# Patient Record
Sex: Male | Born: 1980 | Race: White | Hispanic: No | Smoking: Current every day smoker
Health system: Southern US, Community
[De-identification: ages and names within clinical notes are randomized; demographics above are authoritative.]

## PROBLEM LIST (undated history)

## (undated) DIAGNOSIS — F32A Depression, unspecified: Secondary | ICD-10-CM

## (undated) DIAGNOSIS — R519 Headache, unspecified: Secondary | ICD-10-CM

## (undated) HISTORY — PX: BACK SURGERY: SHX140

## (undated) HISTORY — PX: WISDOM TOOTH EXTRACTION: SHX21

---

## 2001-05-20 ENCOUNTER — Encounter: Payer: Self-pay | Admitting: Emergency Medicine

## 2001-05-20 ENCOUNTER — Emergency Department (HOSPITAL_COMMUNITY): Admission: EM | Admit: 2001-05-20 | Discharge: 2001-05-20 | Payer: Self-pay | Admitting: Emergency Medicine

## 2001-05-20 ENCOUNTER — Emergency Department (HOSPITAL_COMMUNITY): Admission: EM | Admit: 2001-05-20 | Discharge: 2001-05-20 | Payer: Self-pay | Admitting: *Deleted

## 2003-08-13 ENCOUNTER — Emergency Department (HOSPITAL_COMMUNITY): Admission: EM | Admit: 2003-08-13 | Discharge: 2003-08-13 | Payer: Self-pay | Admitting: Emergency Medicine

## 2004-06-12 ENCOUNTER — Encounter: Admission: RE | Admit: 2004-06-12 | Discharge: 2004-06-12 | Payer: Self-pay | Admitting: Neurosurgery

## 2004-06-26 ENCOUNTER — Encounter: Admission: RE | Admit: 2004-06-26 | Discharge: 2004-06-26 | Payer: Self-pay | Admitting: Neurosurgery

## 2006-09-16 ENCOUNTER — Encounter: Admission: RE | Admit: 2006-09-16 | Discharge: 2006-09-16 | Payer: Self-pay | Admitting: Family Medicine

## 2006-09-30 ENCOUNTER — Encounter: Admission: RE | Admit: 2006-09-30 | Discharge: 2006-09-30 | Payer: Self-pay | Admitting: Family Medicine

## 2006-10-21 ENCOUNTER — Encounter: Admission: RE | Admit: 2006-10-21 | Discharge: 2006-10-21 | Payer: Self-pay | Admitting: Family Medicine

## 2007-05-04 ENCOUNTER — Ambulatory Visit (HOSPITAL_COMMUNITY)
Admission: RE | Admit: 2007-05-04 | Discharge: 2007-05-04 | Payer: Self-pay | Admitting: Physical Medicine and Rehabilitation

## 2007-07-05 ENCOUNTER — Ambulatory Visit (HOSPITAL_COMMUNITY): Admission: RE | Admit: 2007-07-05 | Discharge: 2007-07-06 | Payer: Self-pay | Admitting: Orthopedic Surgery

## 2007-09-28 ENCOUNTER — Encounter (INDEPENDENT_AMBULATORY_CARE_PROVIDER_SITE_OTHER): Payer: Self-pay | Admitting: Orthopedic Surgery

## 2007-09-28 ENCOUNTER — Ambulatory Visit (HOSPITAL_COMMUNITY): Admission: RE | Admit: 2007-09-28 | Discharge: 2007-09-29 | Payer: Self-pay | Admitting: Orthopedic Surgery

## 2009-04-03 IMAGING — CR DG ORBITS FOR FOREIGN BODY
2 series · 2 of 2 positions shown · non-contrast
Comparison: none

CLINICAL DATA: Pre-MRI.
 ORBITS ? 2 VIEW:

[w waters (1 of 2)]
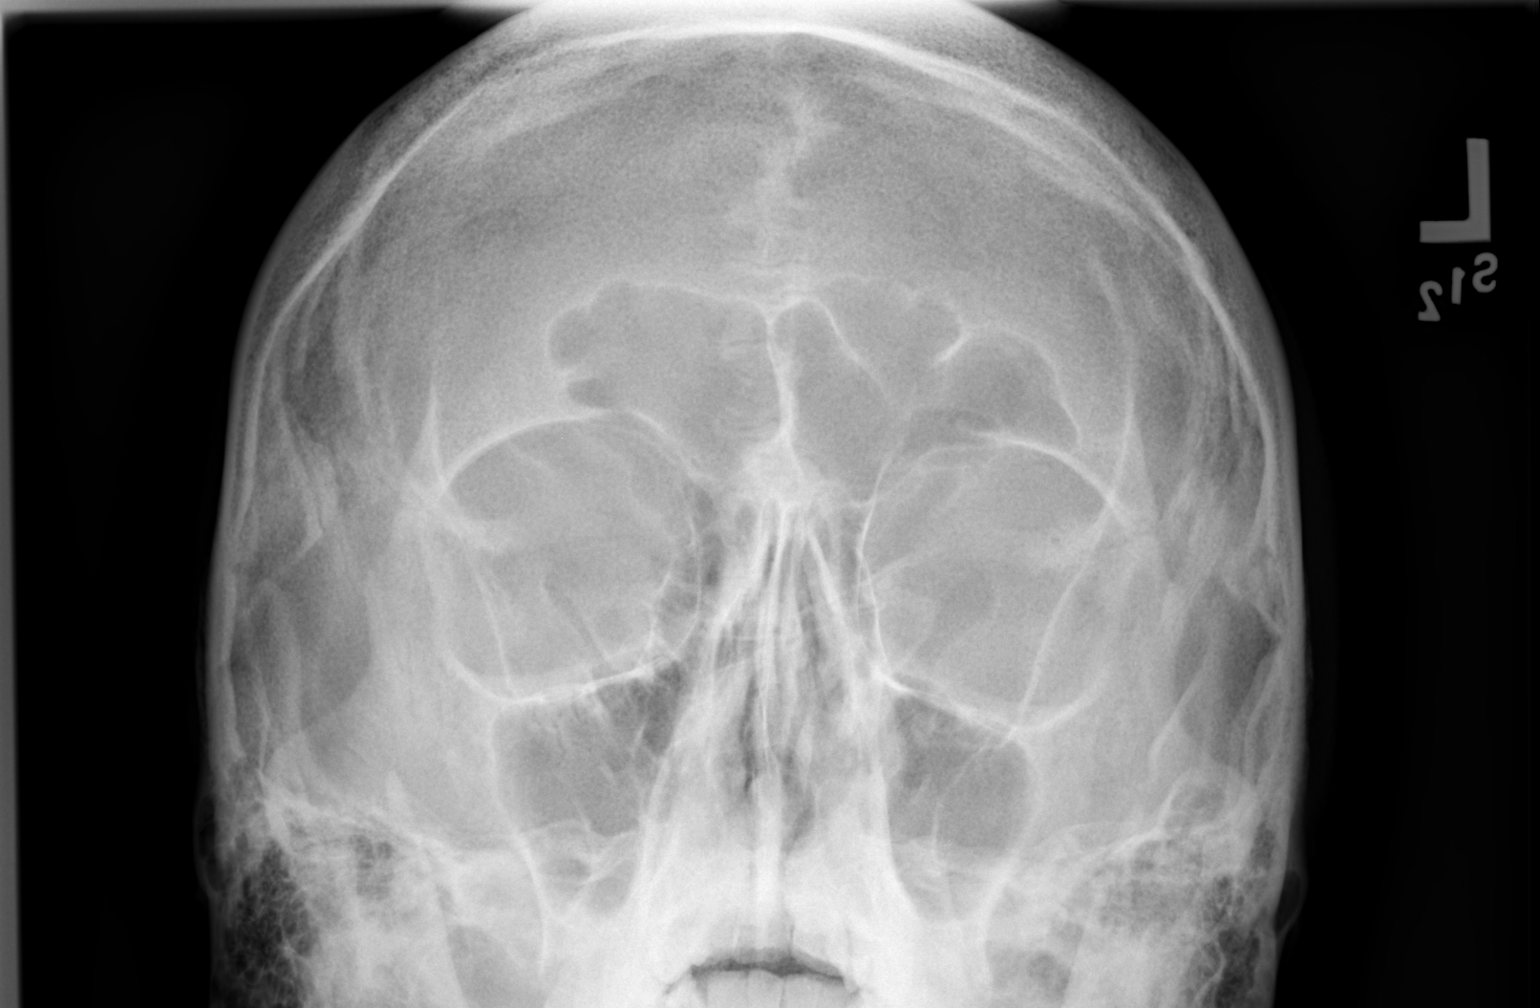

[w waters (2 of 2)]
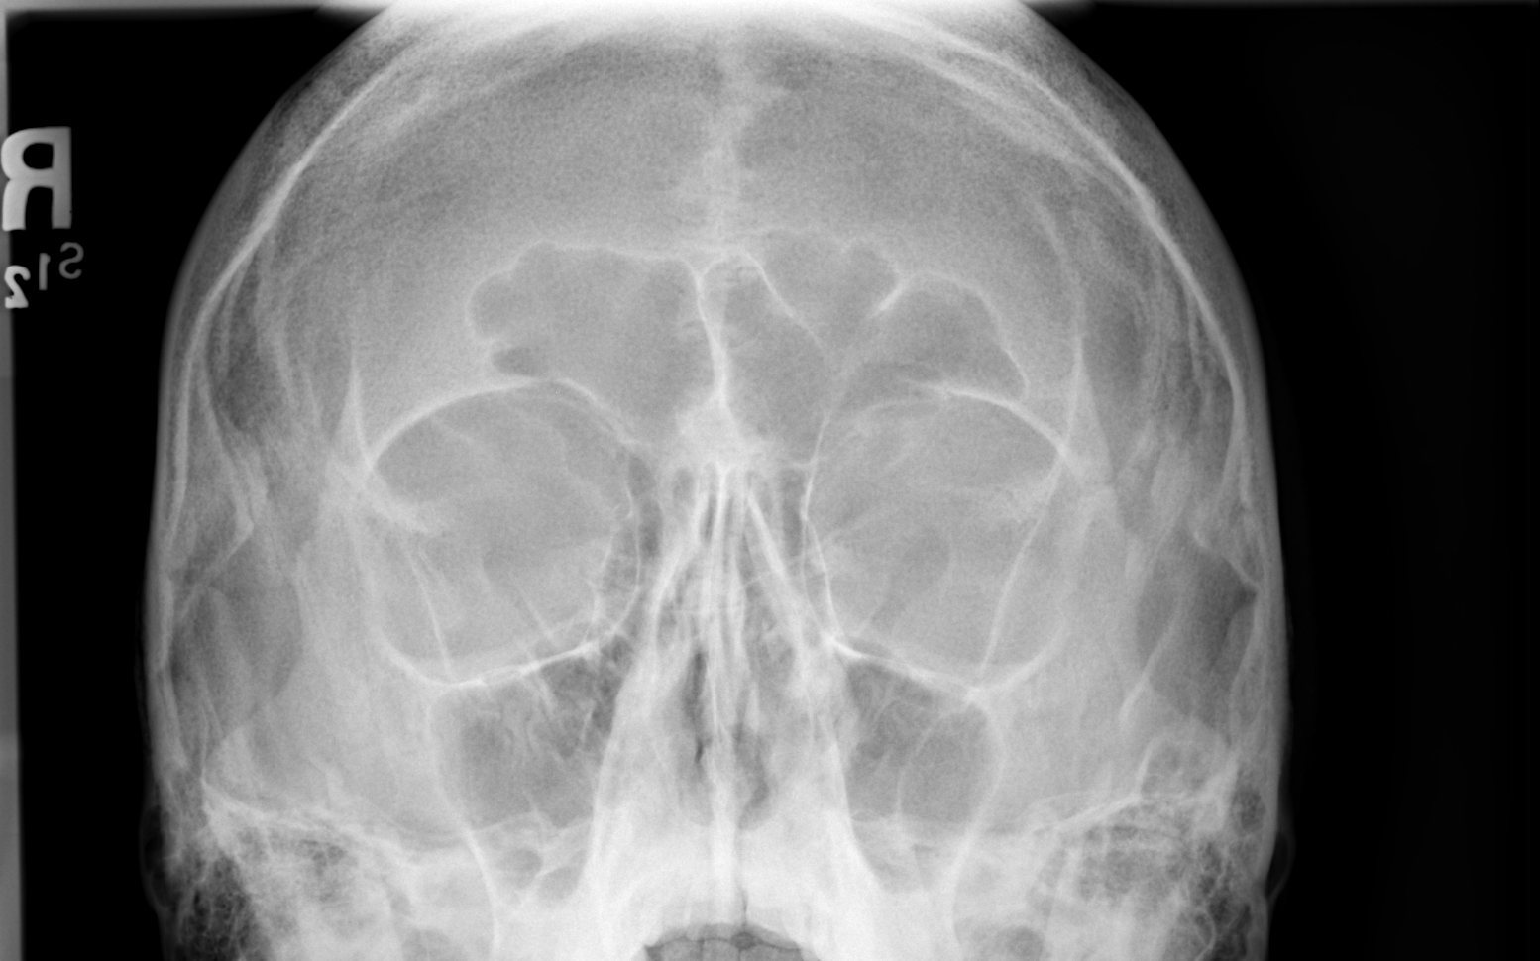

[2 of 2 positions shown; findings below may reference images not displayed]

FINDINGS: Views of the orbits were obtained with the patient looking to the left and looking to the right. No orbital metallic foreign body is seen. No bony abnormality is noted. The sinuses are clear.
IMPRESSION: No orbital metallic foreign body.

## 2009-06-02 IMAGING — CR DG LUMBAR SPINE 2-3V
2 series · 2 of 2 positions shown · non-contrast
Comparison: None.

CLINICAL DATA: 27-year-old male with herniated disc.

LUMBAR SPINE - 2-3 VIEW

[t l-spine a.p.]
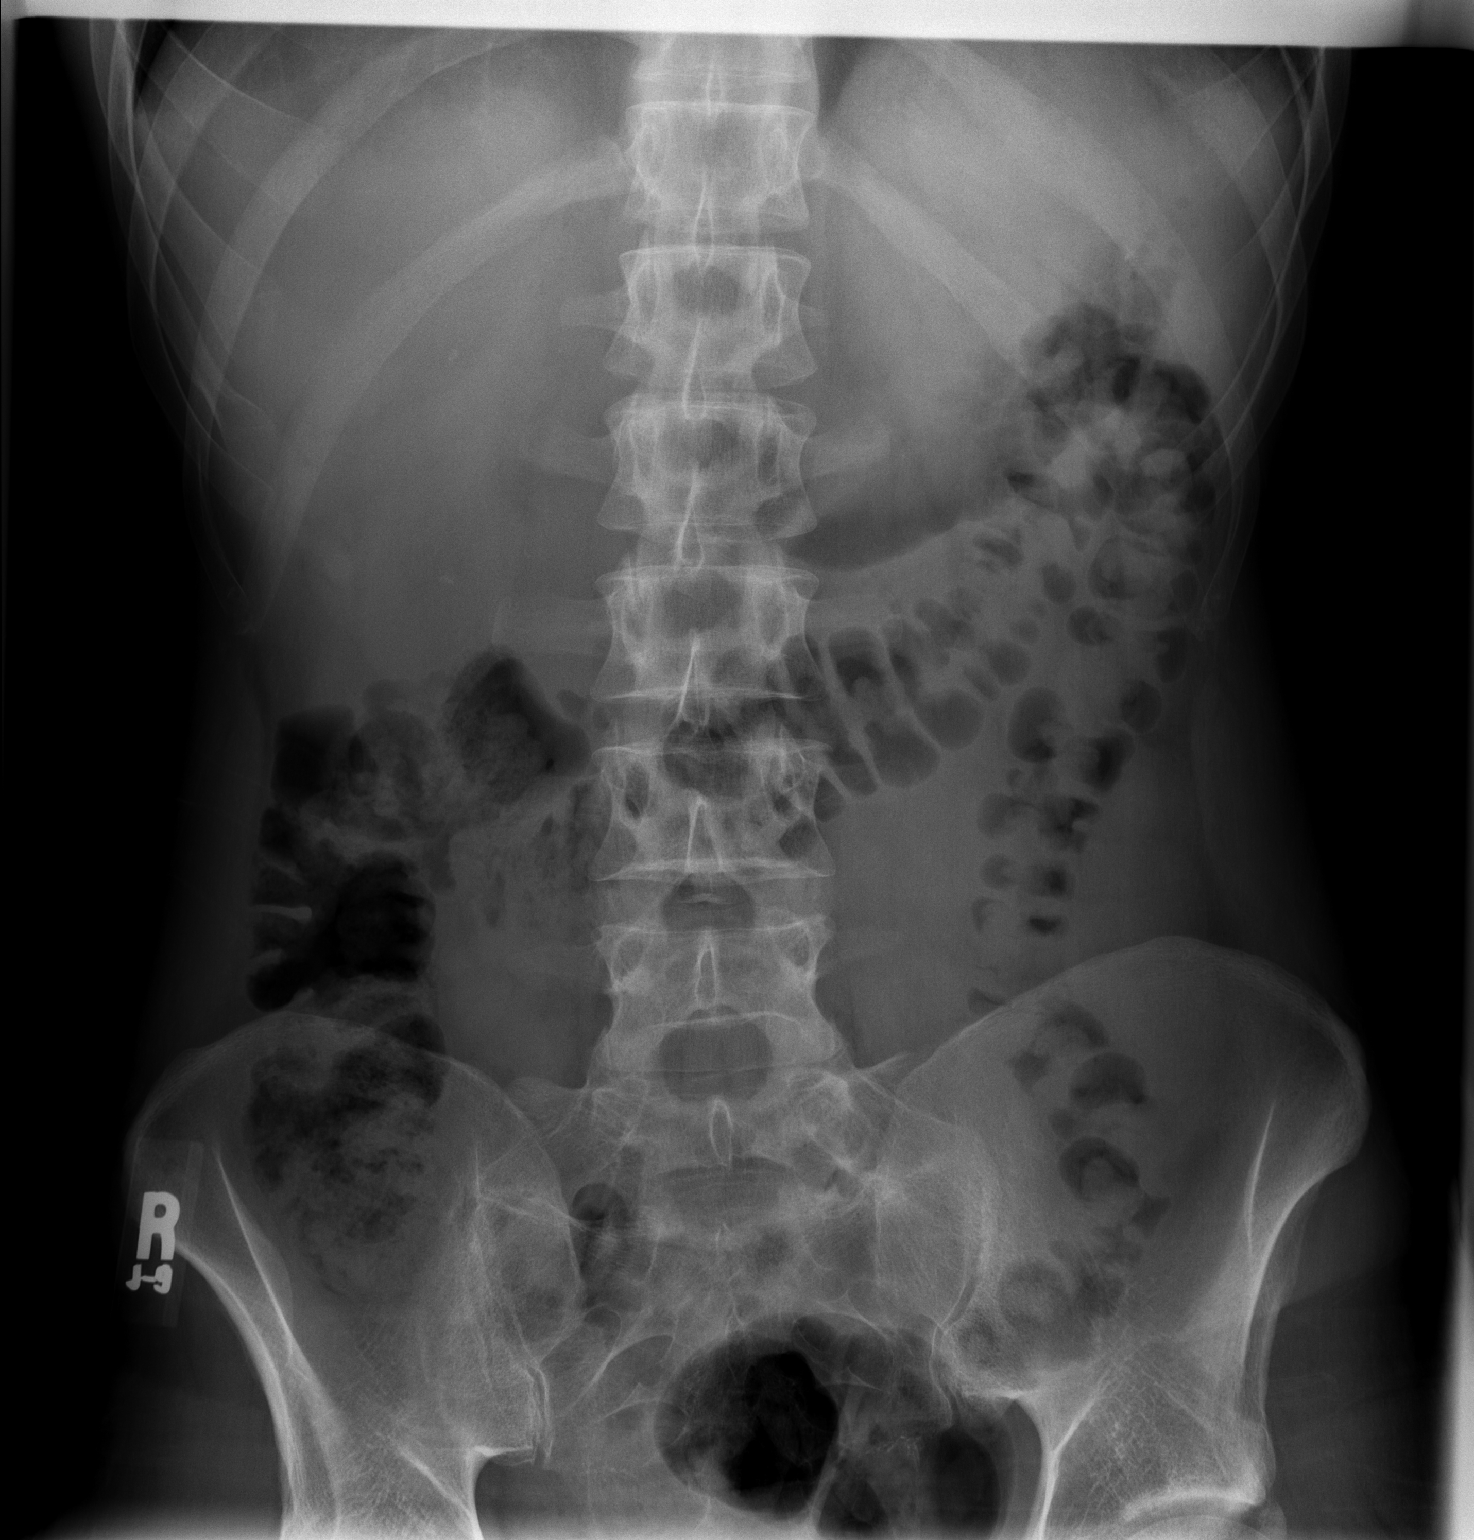

[t l-spine lat]
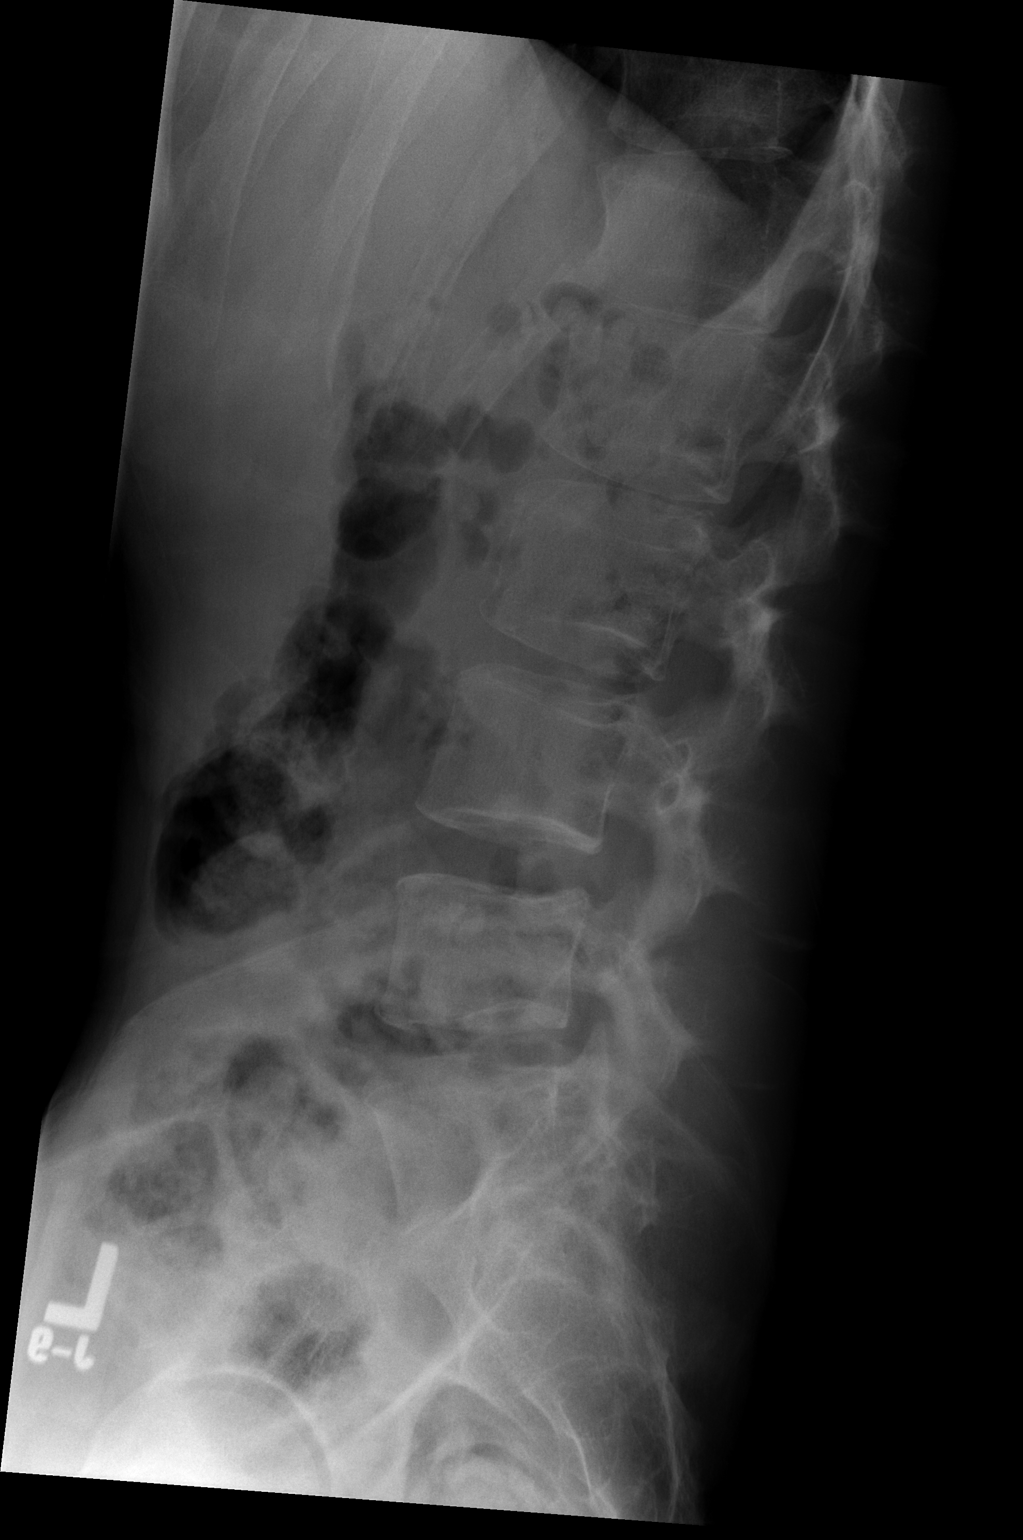

[2 of 2 positions shown; findings below may reference images not displayed]

It is stated that the patient has had an MRI
done at [HOSPITAL].  These images are not currently available.
FINDINGS: Five lumbar-type, nonrib-bearing vertebral bodies are
present.  There is a transitional segment below this level.  The
transitional segment appears to be S1 with the L4-5 disc level at
approximately the level of the iliac crest.  The vertebral body
heights and alignment are maintained.  There is a small disc base
of what is presumed to be S1-2.

Two punctate calcifications are projected over the right renal
shadow.  Nephrolithiasis is considered.
IMPRESSION: 1.  The lumbar spine is labeled with a transitional S1 segment.
2.  Two punctate calcifications projecting over the right renal
shadow may be kidney stones.

## 2009-06-04 IMAGING — CR DG OR PORTABLE SPINE
1 series · 1 of 1 positions shown · non-contrast
Comparison: Lumbar spine x-rays 07/03/2007.  Outside MRI image
viewed.

CLINICAL DATA: L5-S1 disc herniation.

PORTABLE SPINE 1 VIEW 07/05/2007:

[view not recorded]
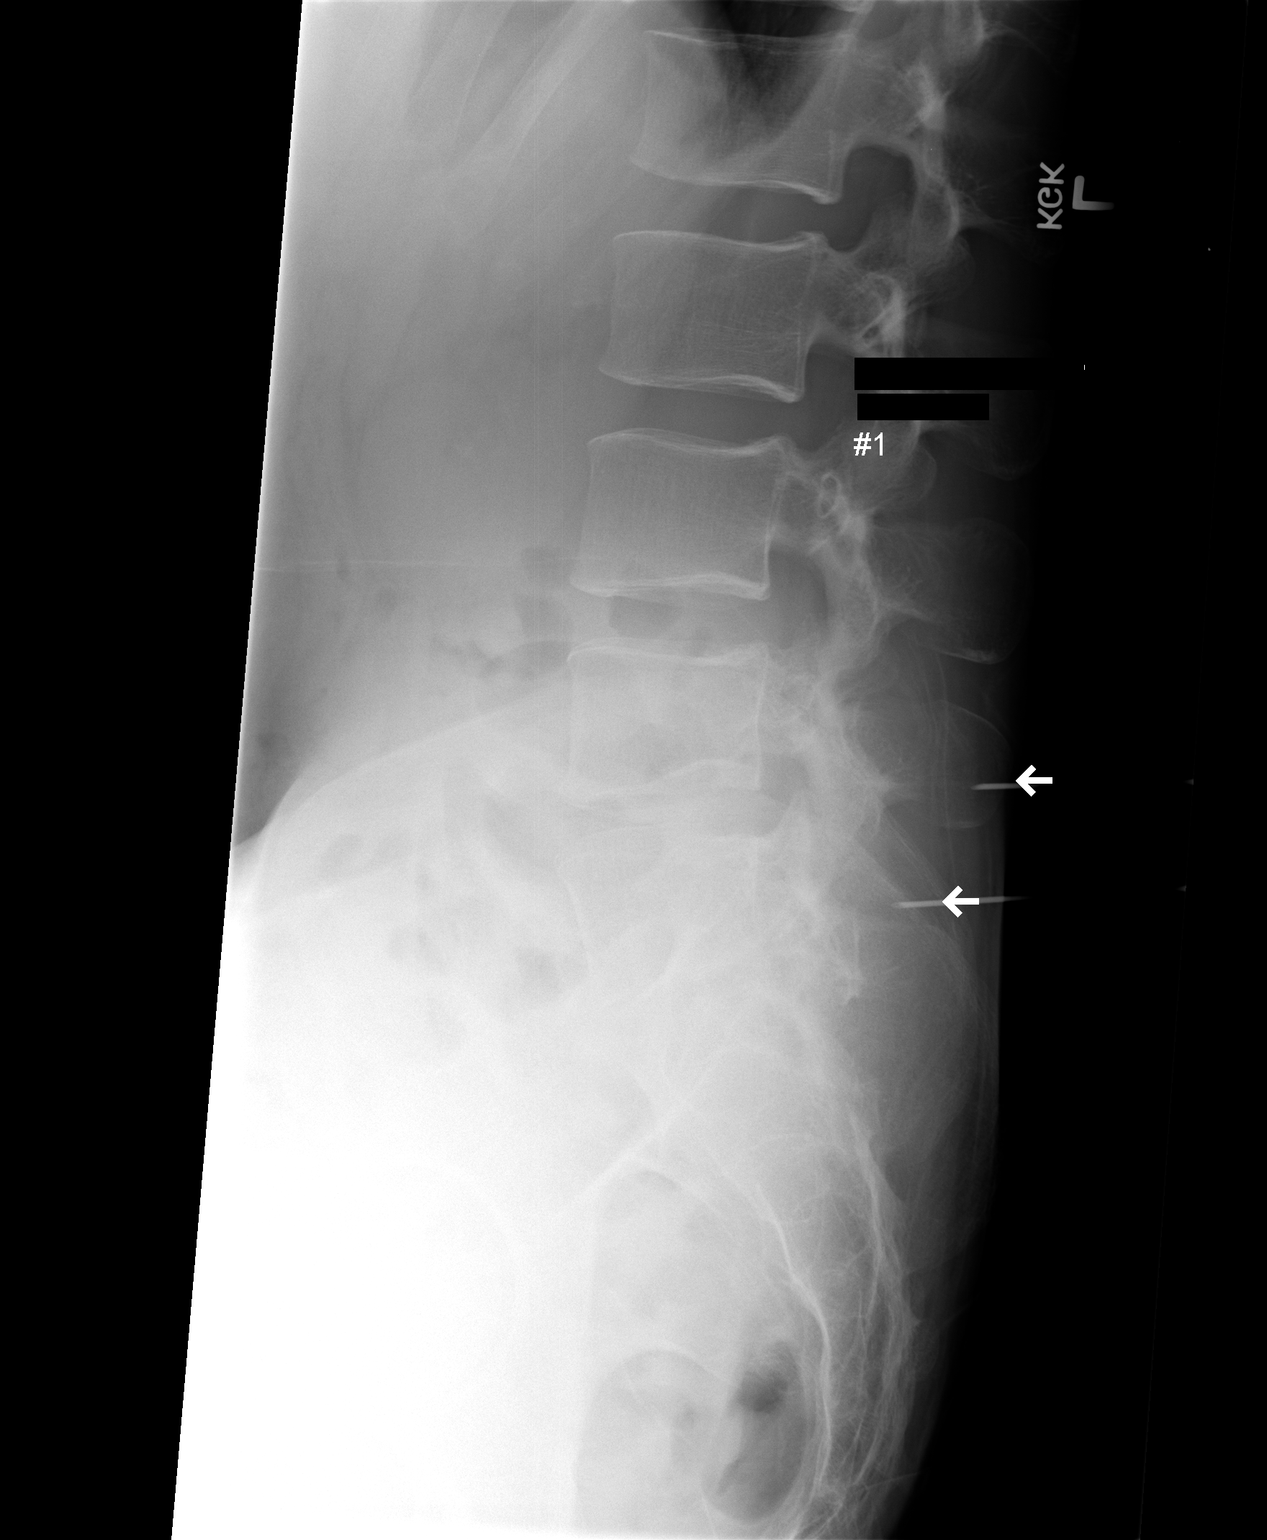

[1 of 1 positions shown; findings below may reference images not displayed]

FINDINGS: Lumbar spine x-rays 07/03/2007 demonstrated five non-rib
bearing lumbar vertebra, with a transitional S1 segment with a well-
defined disc space between it and S2. This initial image obtained
at 6661 hours demonstrates localizing needles adjacent to the
spinous processes of L5 and the transitional S1 segment.
IMPRESSION: L5 and transitional S1 segments localized intraoperatively.

## 2009-06-04 IMAGING — CR DG OR PORTABLE SPINE
1 series · 1 of 1 positions shown · non-contrast
Comparison: Lumbar spine x-rays 07/03/2007.  Outside MRI image
viewed.

CLINICAL DATA: L5-S1 disc herniation.

PORTABLE SPINE 1 VIEW 07/05/2007:

[view not recorded]
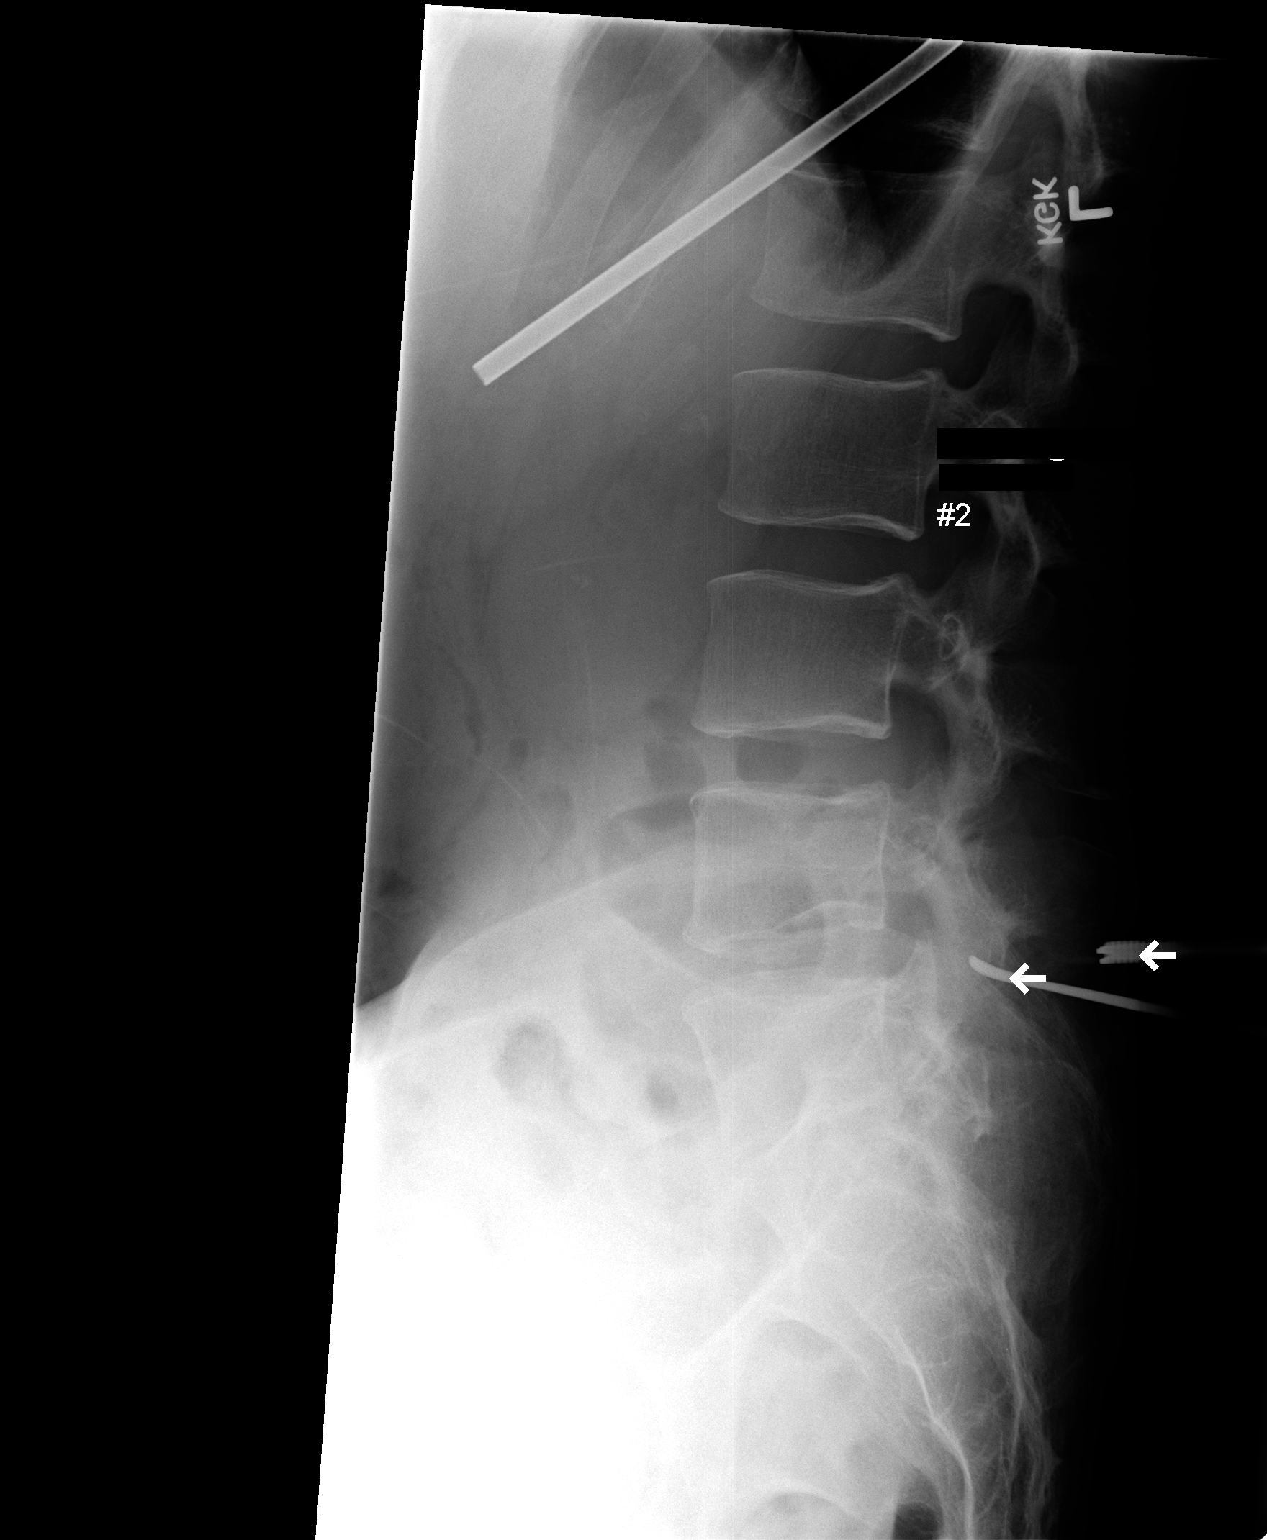

[1 of 1 positions shown; findings below may reference images not displayed]

FINDINGS: Lumbar spine x-rays 07/03/2007 demonstrated five non-rib
bearing lumbar vertebra, with a transitional S1 segment with a well-
defined disc space between it and S2. This second image obtained at
5575 hours demonstrates the localizer device directed toward the L5
- transitional S1 disc space.
IMPRESSION: Intraoperative localization of L5 - transitional S1.

## 2010-07-21 NOTE — Op Note (Signed)
[name], [name]                 ACCOUNT NO.:  [id number]   MEDICAL RECORD NO.:  [id number]          PATIENT TYPE:  OIB   LOCATION:  0098                         FACILITY:  [facility]   PHYSICIAN:  Georges Lynch. Gioffre, M.D.DATE OF BIRTH:  11-08-1980   DATE OF PROCEDURE:  09/28/2007  DATE OF DISCHARGE:                               OPERATIVE REPORT   SURGEON:  Georges Lynch. Darrelyn Hillock, M.D.   ASSISTANT:  Jene Every, M.D.   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  Note:  Prior to doing the  surgery, we initially had an MRI from [facility] that labeled the  space L5-S1 the disk herniation.  We repeated an MRI on him after his  first surgery because of recurrent pain.  The MRI done here showed a  recurrent disk at the same space, only they labeled that here at 4/5 but  there was a difference in calling this same space by a different number.  But anyway, I personally went over that with the radiologist before the  surgery and we both agreed that there was a RECURRENT DISK AT THE  PREVIOUS SURGICAL SITE, WHICH IS L5-S1, so we would get that straight.   OPERATION:  1. Hemilaminectomy at L5-S1 on the right.  2. Microdiskectomy at L5-S1 on the right for recurrent disk.  3. Foraminotomy at L5-S1 on the right.  4. Excision of scar tissues at L5-S1 on the right.   DESCRIPTION OF PROCEDURE:  Under general anesthesia with the patient on  a spinal frame, routine orthopedic prep and draping of the low back was  carried out.  Two needles were placed in the back and an x-ray was  taken.  The x-ray was gone over with the radiologist because of the  bizarre labeling of the vertebrae.  I explained to the radiologist on  the phone during the procedure that the disk was a recurrent disk as  read on the MRI at the same level where he had previous surgeries, so  that was at L5-S1.  So basically an incision was made that went down the  midline.  We separated the muscle bilaterally from the spinous process  in order insert our retractors.  I then went down and identified the  previous surgical site, which we will call L5-S1 on the right.  There  was a marked amount of scar tissue.  I removed the scar tissue from the  lamina at L5-S1 on the right.  We then went down and removed the  excessive scar tissue of the lateral recess.  We curetted out the  lateral recess, decompressed that, and identified the S1 root.  Note:  The root was quite scarred down at this point.  We gently retracted the  dura and noted another large recurrent disk herniation.  We went down  and made a cruciate incision in the posterior longitudinal ligament,  went down and did a diskectomy at L5-S1 on the right.  When we completed  procedure, we also did a foraminotomy for the  S1 root.  The root down,  the dura was free.  The root was quite swollen.  SO THERE WAS NOT A  QUESTION OF WHERE WE WERE; IT IS A MATTER OF HOW YOU WANT TO LABEL THE  VERTEBRAE.  We were at the previous operative site for a recurrent disk  at L4-5 on the right and that is what the MRI said as well.  We  thoroughly irrigated out the area.  No Gelfoam was used.  We then closed  the wound in layers in the usual  fashion.  I left a small deep portion of the wound open for drainage  purposes.  The subcu was closed with 0 Vicryl.  THE SKIN THIS TIME WAS  CLOSED WITH 3-0 NYLON SUTURE BECAUSE HE HAD A LITTLE PROBLEM WITH  STAPLES LAST TIME.  He had 1 gram of IV Ancef preop.           ______________________________  Georges Lynch. Darrelyn Hillock, M.D.     RAG/MEDQ  D:  09/28/2007  T:  09/28/2007  Job:  [id number]

## 2010-07-21 NOTE — Op Note (Signed)
NAMEMAXMILLIAN, [name]                 ACCOUNT NO.:  [id number]   MEDICAL RECORD NO.:  [id number]          PATIENT TYPE:  AMB   LOCATION:  DAY                          FACILITY:  [facility]   PHYSICIAN:  Ronald A. Gioffre, M.D.DATE OF BIRTH:  [birth year]   DATE OF PROCEDURE:  07/05/2007  DATE OF DISCHARGE:                               OPERATIVE REPORT   SURGEON:  Georges Lynch. Darrelyn Hillock, M.D.   ASSISTANT:  Jene Every, M.D.   PREOPERATIVE DIAGNOSIS:  Large herniated lumbar disk, L5-S1 on the  right.  Note he had transitional vertebra at S1-S2 but the L5 vertebra  was somewhat  retrolisthesis in other words, there was retrolisthesis of 5 on 1 which  helped more to identify the exact space and it was the last large open  space.  Note prior to the surgery, great care was taken.  I went to the  radiologist and had him help me label these films. We labeled the plain  films, the MRI, so we made sure that we were all talking about the same  space.  We also went through that at the time of surgery several times.  We marked the space with the back open make to sure we were at the  proper space.   POSTOPERATIVE DIAGNOSIS:  Large herniated lumbar disk, L5-S1 on the  right.   OPERATION:  Hemilaminectomy, microdiskectomy, L5-S1 on the right.   PROCEDURE:  Under general anesthesia, routine orthopedic prep and drape  of the low back carried out.  Two needles were placed in back for  localization purposes.  An x-ray was taken.  I asked the specifically to  read those films to make sure that we were at the proper space and  coordinate that with the MRI and we were.  So we then proceeded with the  incision over the L5-S1 space.  Bleeders identified and cauterized and  cauterized.  Incision was carried down through to the spinous processes.  We separated the muscle from the spinous process and the lamina at L5-  S1.  We identified the sacrum.  Another x-ray was taken to verify the  space and that x-ray was  read as well.  Following that we brought the  microscope in, removed the ligamentum flavum, with great care taken not  to injure the dura.  We completed our hemilaminectomy proximally and  distally, did a nice foraminotomy distally.  We identified a nerve root,  gently retracted it.  That is the S1 root.  Following that, we made a  cruciate incision in the disk into the posterior longitudinal ligament  and disk material extruded out under pressure.  He had a large herniated  disk as was shown on the MRI.  We completed diskectomy by utilizing a  nerve hook point going into the subligamentous space as well as the  Epstein curettes.  We went down into the space several times and across  the midline and removed all the disk material and also went out  laterally.  When this was completed, we thoroughly decompressed the root  and the dura and  were able to easily move the root back and forth  without any resistance and we were able to easily pass a hockey-stick  down the foramen.  We thoroughly irrigated out the area, loosely applied  some thrombin-soaked Gelfoam, closed the wound layers in usual fashion.  I left a small deep distal part of the wound open for drainage purposes.  Subcu was closed with 0 Vicryl.  The skin was closed with metal staples.  Sterile Neosporin dressing was applied.  The patient had 1 gram of IV  Ancef preop.           ______________________________  Georges Lynch. Darrelyn Hillock, M.D.     RAG/MEDQ  D:  07/05/2007  T:  07/05/2007  Job:  [id number]

## 2010-12-01 LAB — DIFFERENTIAL
Basophils Absolute: 0
Basophils Relative: 0
Eosinophils Absolute: 0.2
Eosinophils Relative: 2
Lymphocytes Relative: 36
Lymphs Abs: 3.1
Monocytes Absolute: 0.5
Monocytes Relative: 6
Neutro Abs: 4.9
Neutrophils Relative %: 56

## 2010-12-01 LAB — CBC
HCT: 41.2
Hemoglobin: 14.3
MCHC: 34.6
MCV: 98.2
Platelets: 250
RBC: 4.2 — ABNORMAL LOW
RDW: 12.3
WBC: 8.7

## 2010-12-01 LAB — COMPREHENSIVE METABOLIC PANEL
ALT: 17
AST: 17
Albumin: 4.3
Alkaline Phosphatase: 61
BUN: 10
CO2: 32
Calcium: 9.5
Chloride: 103
Creatinine, Ser: 0.89
GFR calc Af Amer: >60
GFR calc non Af Amer: >60
Glucose, Bld: 102 — ABNORMAL HIGH
Potassium: 4.4
Sodium: 139
Total Bilirubin: 0.7
Total Protein: 6.2

## 2010-12-01 LAB — URINALYSIS, ROUTINE W REFLEX MICROSCOPIC
Bilirubin Urine: NEGATIVE
Glucose, UA: NEGATIVE
Hgb urine dipstick: NEGATIVE
Ketones, ur: NEGATIVE
Nitrite: NEGATIVE
Protein, ur: NEGATIVE
Specific Gravity, Urine: 1.014
Urobilinogen, UA: 0.2
pH: 6

## 2010-12-01 LAB — APTT: aPTT: 32

## 2010-12-01 LAB — PROTIME-INR
INR: 1
Prothrombin Time: 13.6

## 2010-12-04 LAB — DIFFERENTIAL
Basophils Absolute: 0.2 — ABNORMAL HIGH
Basophils Relative: 2 — ABNORMAL HIGH
Eosinophils Absolute: 0.3
Eosinophils Relative: 3
Lymphocytes Relative: 44
Lymphs Abs: 4
Monocytes Absolute: 0.6
Monocytes Relative: 6
Neutro Abs: 4.1
Neutrophils Relative %: 45

## 2010-12-04 LAB — URINALYSIS, ROUTINE W REFLEX MICROSCOPIC
Bilirubin Urine: NEGATIVE
Glucose, UA: NEGATIVE
Hgb urine dipstick: NEGATIVE
Ketones, ur: NEGATIVE
Nitrite: NEGATIVE
Protein, ur: NEGATIVE
Specific Gravity, Urine: 1.02
Urobilinogen, UA: 0.2
pH: 6

## 2010-12-04 LAB — COMPREHENSIVE METABOLIC PANEL
ALT: 23
AST: 23
Albumin: 4.7
Alkaline Phosphatase: 82
BUN: 7
CO2: 30
Calcium: 9.9
Chloride: 104
Creatinine, Ser: 0.91
GFR calc Af Amer: >60
GFR calc non Af Amer: >60
Glucose, Bld: 98
Potassium: 3.4 — ABNORMAL LOW
Sodium: 141
Total Bilirubin: 1
Total Protein: 6.9

## 2010-12-04 LAB — CBC
HCT: 43.6
Hemoglobin: 15.1
MCHC: 34.6
MCV: 98.7
Platelets: 239
RBC: 4.42
RDW: 11.4 — ABNORMAL LOW
WBC: 9.2

## 2010-12-04 LAB — PROTIME-INR
INR: 1
Prothrombin Time: 13.7

## 2010-12-04 LAB — URINE CULTURE
Colony Count: NO GROWTH
Culture: NO GROWTH
Special Requests: NEGATIVE

## 2010-12-04 LAB — APTT: aPTT: 31

## 2011-09-17 ENCOUNTER — Emergency Department (HOSPITAL_COMMUNITY)
Admission: EM | Admit: 2011-09-17 | Discharge: 2011-09-17 | Disposition: A | Discharge status: Home / Self Care | Payer: BC Managed Care – PPO | Attending: Emergency Medicine | Admitting: Emergency Medicine

## 2011-09-17 ENCOUNTER — Encounter (HOSPITAL_COMMUNITY): Payer: Self-pay | Admitting: Emergency Medicine

## 2011-09-17 DIAGNOSIS — M545 Low back pain, unspecified: Secondary | ICD-10-CM | POA: Insufficient documentation

## 2011-09-17 DIAGNOSIS — G8929 Other chronic pain: Secondary | ICD-10-CM

## 2011-09-17 DIAGNOSIS — Z760 Encounter for issue of repeat prescription: Secondary | ICD-10-CM | POA: Insufficient documentation

## 2011-09-17 MED ORDER — HYDROCODONE-ACETAMINOPHEN 5-325 MG PO TABS: 2.0000 | ORAL_TABLET | ORAL | Status: AC | PRN

## 2011-09-17 NOTE — ED Provider Notes (Signed)
History     CSN: [id number]  Arrival date & time 09/17/11  1659   First MD Initiated Contact with Patient 09/17/11 1724      Chief Complaint  Patient presents with  . Medication Refill    (Consider location/radiation/quality/duration/timing/severity/associated sxs/prior treatment) HPI  31 year old male with history of back surgery in 09 presents requesting for pain medication. Patient states he has had chronic pain to his low back. He is in between doctor and has ran out of his pain medication.  Requesting for pain meds as he is afraid of going through withdrawal. His last pain medication was 2 days ago. Patient states he has been schedule to have further management through [facility].  Pt is currently awaiting toxicology test to be cleared before further management.  However, his medication is out and his PCP refuses to refill his meds.  Pt sts pain to low back and R leg has been the same.  Denies fever, rash, urinary/bowel incontinence or caudal equina sxs.  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      No past medical history on file.  Past Surgical History  Procedure Date  . Back surgery     No family history on file.  History  Substance Use Topics  . Smoking status: Not on file  . Smokeless tobacco: Not on file  . Alcohol Use:       Review of Systems  Constitutional: Negative for fever.  Cardiovascular: Negative for leg swelling.  Genitourinary: Negative for testicular pain.  Skin: Negative for rash and wound.  Neurological: Negative for weakness and numbness.  All other systems reviewed and are negative.    Allergies  Review of patient's allergies indicates no known allergies.  Home Medications   Current Outpatient Rx  Name Route Sig Dispense Refill  . HYDROCODONE-ACETAMINOPHEN 10-325 MG PO TABS Oral Take 1 tablet by mouth every 6 (six) hours as needed. Pain      BP 139/52   Pulse 88  Temp 98.9 F (37.2 C) (Oral)  Resp 20  Wt 187 lb (84.823 kg)  SpO2 100%  Physical Exam  Nursing note and vitals reviewed. Constitutional: He appears well-developed and well-nourished. No distress.  HENT:  Head: Atraumatic.  Eyes: Conjunctivae are normal.  Neck: Neck supple.  Cardiovascular: Normal rate and regular rhythm.   Pulmonary/Chest: Effort normal. No respiratory distress.  Abdominal: Soft. There is no tenderness.  Musculoskeletal: Normal range of motion. He exhibits no edema and no tenderness.       Cervical back: Normal.       Thoracic back: Normal.       Lumbar back: Normal.  Neurological: He is alert.  Skin: Skin is warm. No rash noted.    ED Course  Procedures (including critical care time)  Labs Reviewed - No data to display No results found.   No diagnosis found.  1. Chronic back pain.  MDM  Pt with chronic back pain. No red flags, no new pain.  I have checked pt using [place] drug database, and pt appears to be compliant with his pain medication.  His last prescription is 6/11 for 25 days supply of percocet.   Plan to provide a short course of pain medication.  Strong recommendation to continue care through pain management.  Will also give resources.        Fayrene Helper, PA-C 09/17/11 1751

## 2011-09-17 NOTE — ED Notes (Signed)
Pt. Reports he is in between doctors and his pain medication ran out.  His new doctor cannot give any pain medication until toxicology test done.  Pt. Reports back pain is a 5 right now.  Pt. Has had surgeries for ruptured discs.

## 2011-09-18 NOTE — ED Provider Notes (Signed)
Medical screening examination/treatment/procedure(s) were performed by non-physician practitioner and as supervising physician I was immediately available for consultation/collaboration.  Gerhard Munch, MD 09/18/11 2138

## 2015-03-10 ENCOUNTER — Emergency Department (HOSPITAL_BASED_OUTPATIENT_CLINIC_OR_DEPARTMENT_OTHER)
Admission: EM | Admit: 2015-03-10 | Discharge: 2015-03-10 | Disposition: A | Discharge status: Home / Self Care | Payer: BLUE CROSS/BLUE SHIELD | Attending: Emergency Medicine | Admitting: Emergency Medicine

## 2015-03-10 ENCOUNTER — Encounter (HOSPITAL_BASED_OUTPATIENT_CLINIC_OR_DEPARTMENT_OTHER): Payer: Self-pay | Admitting: *Deleted

## 2015-03-10 DIAGNOSIS — F1210 Cannabis abuse, uncomplicated: Secondary | ICD-10-CM | POA: Diagnosis not present

## 2015-03-10 DIAGNOSIS — F419 Anxiety disorder, unspecified: Secondary | ICD-10-CM | POA: Diagnosis not present

## 2015-03-10 DIAGNOSIS — R110 Nausea: Secondary | ICD-10-CM | POA: Diagnosis not present

## 2015-03-10 DIAGNOSIS — F32A Depression, unspecified: Secondary | ICD-10-CM

## 2015-03-10 DIAGNOSIS — R630 Anorexia: Secondary | ICD-10-CM | POA: Insufficient documentation

## 2015-03-10 DIAGNOSIS — F17210 Nicotine dependence, cigarettes, uncomplicated: Secondary | ICD-10-CM | POA: Insufficient documentation

## 2015-03-10 DIAGNOSIS — R251 Tremor, unspecified: Secondary | ICD-10-CM | POA: Diagnosis not present

## 2015-03-10 DIAGNOSIS — Z008 Encounter for other general examination: Secondary | ICD-10-CM | POA: Diagnosis present

## 2015-03-10 DIAGNOSIS — F329 Major depressive disorder, single episode, unspecified: Secondary | ICD-10-CM | POA: Diagnosis not present

## 2015-03-10 LAB — CBC WITH DIFFERENTIAL/PLATELET
Basophils Absolute: 0 10*3/uL (ref 0.0–0.1)
Basophils Relative: 0 %
Eosinophils Absolute: 0.1 10*3/uL (ref 0.0–0.7)
Eosinophils Relative: 1 %
HCT: 44.2 % (ref 39.0–52.0)
Hemoglobin: 15.3 g/dL (ref 13.0–17.0)
Lymphocytes Relative: 23 %
Lymphs Abs: 2.4 10*3/uL (ref 0.7–4.0)
MCH: 33.2 pg (ref 26.0–34.0)
MCHC: 34.6 g/dL (ref 30.0–36.0)
MCV: 95.9 fL (ref 78.0–100.0)
Monocytes Absolute: 0.9 10*3/uL (ref 0.1–1.0)
Monocytes Relative: 8 %
Neutro Abs: 7.2 10*3/uL (ref 1.7–7.7)
Neutrophils Relative %: 68 %
Platelets: 311 10*3/uL (ref 150–400)
RBC: 4.61 MIL/uL (ref 4.22–5.81)
RDW: 11.7 % (ref 11.5–15.5)
WBC: 10.5 10*3/uL (ref 4.0–10.5)

## 2015-03-10 LAB — RAPID URINE DRUG SCREEN, HOSP PERFORMED
Amphetamines: NOT DETECTED
Barbiturates: NOT DETECTED
Benzodiazepines: NOT DETECTED
Cocaine: NOT DETECTED
Opiates: NOT DETECTED
Tetrahydrocannabinol: POSITIVE — AB

## 2015-03-10 LAB — COMPREHENSIVE METABOLIC PANEL
ALT: 14 U/L — ABNORMAL LOW (ref 17–63)
AST: 17 U/L (ref 15–41)
Albumin: 4.8 g/dL (ref 3.5–5.0)
Alkaline Phosphatase: 91 U/L (ref 38–126)
Anion gap: 8 (ref 5–15)
BUN: 9 mg/dL (ref 6–20)
CO2: 24 mmol/L (ref 22–32)
Calcium: 9.4 mg/dL (ref 8.9–10.3)
Chloride: 107 mmol/L (ref 101–111)
Creatinine, Ser: 0.83 mg/dL (ref 0.61–1.24)
GFR calc Af Amer: >60 mL/min (ref >60)
GFR calc non Af Amer: >60 mL/min (ref >60)
Glucose, Bld: 96 mg/dL (ref 65–99)
Potassium: 3.3 mmol/L — ABNORMAL LOW (ref 3.5–5.1)
Sodium: 139 mmol/L (ref 135–145)
Total Bilirubin: 1 mg/dL (ref 0.3–1.2)
Total Protein: 7.4 g/dL (ref 6.5–8.1)

## 2015-03-10 LAB — ETHANOL: Alcohol, Ethyl (B): <5 mg/dL (ref <5)

## 2015-03-10 MED ORDER — HYDROXYZINE HCL 25 MG PO TABS: ORAL_TABLET | ORAL | Status: DC

## 2015-03-10 MED ORDER — LORAZEPAM 1 MG PO TABS
1.0000 mg | ORAL_TABLET | Freq: Once | ORAL | Status: AC
  Administered 2015-03-10: 1 mg via ORAL
  Filled 2015-03-10: qty 1

## 2015-03-10 MED ORDER — TRAZODONE HCL 50 MG PO TABS: 50.0000 mg | ORAL_TABLET | Freq: Every day | ORAL | Status: DC

## 2015-03-10 NOTE — ED Provider Notes (Addendum)
CSN: [id number]     Arrival date & time 03/10/15  1525 History  By signing my name below, I, Budd Palmer, attest that this documentation has been prepared under the direction and in the presence of Margarita Grizzle, MD. Electronically Signed: Budd Palmer, ED Scribe. 03/10/2015. 4:54 PM.    Chief Complaint  Patient presents with  . Medical Clearance   The history is provided by the patient and a parent. No language interpreter was used.   HPI Comments: [name] is a 35 y.o. male smoker at 1 ppd who presents to the Emergency Department for medical clearance. Pt states he has come in for depression. He notes that he has had a tough past year and feels as though nothing he does makes a difference to change this. He notes that his girlfriend recently cheated on him. He reports associated loss of appetite, inability to sleep, nausea, palpitations, and tremors earlier today. He notes he felt as though he was having a panic attack earlier today. He reports a PSHx of back surgery and states he takes three 10 mg percocet and three 25 mg oxycodone per day. He notes that sometimes he takes more than these dosages, depending on his pain and runs out a few days early every once in a while. He states his PCP is aware of this and has prescribed him some medications to treat the withdrawal symptoms. He notes he has also been drinking alcohol as well (3-4 shots of vodka every night). He denies a PMHx of alcohol issues.  He notes he is still working. He notes he smokes marijuana occasionally, but has not done so for the past week. Pt denies SI and MI.   Per mom, pt came to see her last night and talked with her. She denies pt having SI or threatening to harm himself.   History reviewed. No pertinent past medical history. Past Surgical History  Procedure Laterality Date  . Back surgery     No family history on file. Social History  Substance Use Topics  . Smoking status: Current Every Day Smoker -- 1.00  packs/day    Types: Cigarettes  . Smokeless tobacco: None  . Alcohol Use: Yes     Comment: daily    Review of Systems  Constitutional: Positive for activity change and appetite change.  Cardiovascular: Positive for palpitations.  Gastrointestinal: Positive for nausea.  Neurological: Positive for tremors.  Psychiatric/Behavioral: Positive for dysphoric mood. Negative for suicidal ideas and self-injury. The patient is nervous/anxious.   All other systems reviewed and are negative.   Allergies  Review of patient's allergies indicates no known allergies.  Home Medications   Prior to Admission medications   Medication Sig Start Date End Date Taking? Authorizing Provider  Oxycodone-Acetaminophen (PERCOCET PO) Take by mouth.   Yes Historical Provider, MD   BP 151/104 mmHg  Pulse 108  Temp(Src) 98.2 F (36.8 C) (Oral)  Resp 16  Ht 6\' 6"  (1.981 m)  Wt 185 lb (83.915 kg)  BMI 21.38 kg/m2  SpO2 100% Physical Exam  Constitutional: He is oriented to person, place, and time. He appears well-developed and well-nourished.  HENT:  Head: Normocephalic and atraumatic.  Right Ear: External ear normal.  Left Ear: External ear normal.  Nose: Nose normal.  Mouth/Throat: Oropharynx is clear and moist.  Eyes: Conjunctivae and EOM are normal. Pupils are equal, round, and reactive to light.  Neck: Normal range of motion. Neck supple.  Cardiovascular: Normal rate, regular rhythm, normal heart sounds  and intact distal pulses.   Pulmonary/Chest: Effort normal and breath sounds normal. No respiratory distress. He has no wheezes. He exhibits no tenderness.  Abdominal: Soft. Bowel sounds are normal. He exhibits no distension and no mass. There is no tenderness. There is no guarding.  Musculoskeletal: Normal range of motion.  Neurological: He is alert and oriented to person, place, and time. He has normal reflexes. He exhibits normal muscle tone. Coordination normal.  Skin: Skin is warm and dry.   Psychiatric: His behavior is normal. Judgment and thought content normal. He exhibits a depressed mood.  Nursing note and vitals reviewed.   ED Course  Procedures  DIAGNOSTIC STUDIES: Oxygen Saturation is 100% on RA, normal by my interpretation.    COORDINATION OF CARE: 4:07 PM - Discussed plans to wait on diagnostic studies and to order a mental health consult. Pt advised of plan for treatment and pt agrees.  Labs Review Labs Reviewed  COMPREHENSIVE METABOLIC PANEL - Abnormal; Notable for the following:    Potassium 3.3 (*)    ALT 14 (*)    All other components within normal limits  URINE RAPID DRUG SCREEN, HOSP PERFORMED - Abnormal; Notable for the following:    Tetrahydrocannabinol POSITIVE (*)    All other components within normal limits  ETHANOL  CBC WITH DIFFERENTIAL/PLATELET    Imaging Review No results found. I have personally reviewed and evaluated these images and lab results as part of my medical decision-making.   EKG Interpretation None     Discussed with patient, family and behavioral health assessment team.  Patient and family understand plan for follow up and treatment and are in agreement.  MDM   Final diagnoses:  Anxiety  Depression    I personally performed the services described in this documentation, which was scribed in my presence. The recorded information has been reviewed and considered.   Margarita Grizzleanielle Lasaundra Riche, MD 03/10/15 [id number]  Margarita Grizzleanielle Nurah Petrides, MD 03/25/15 [phone]

## 2015-03-10 NOTE — ED Notes (Signed)
Camera and monitor setup at door when TTS calls.

## 2015-03-10 NOTE — ED Notes (Signed)
States he is under a lot of stress. Feels like he is going to have a panic attack. His mother states he told her he was having bad thoughts last night.

## 2015-03-10 NOTE — ED Notes (Signed)
Tele-a-scope machine was sat up in room 12 and progress will begun in 15 minutes.

## 2015-03-10 NOTE — Discharge Instructions (Signed)
Please call [facility] tomorrow at [phone] for appointment.  They should be aware that you need appointment and may call you.  Return if any thoughts of harming yourself or anyone else.

## 2015-03-10 NOTE — BH Assessment (Addendum)
Tele Assessment Note   [name] is an 35 y.o. male presenting this date having symptoms associated with on going depression. He notes that he has had a series of issues over the last few months associated with relationships and loss that have caused him to be depressed reporting that he "can't stop thinking about my deceased wife." Patient reports he was married and during that marriage his wife passed away and he states he has ongoing feelings of guilt associated with not being able to "let go of her." The relationship is complicated by another current failed relationship where patient reports he has been seeing his current partner for two years and just received information that she was cheating on him. He reports loss of appetite, inability to sleep, nausea, and racing thoughts. Patient denies any SI or thoughts of self harm but does admit to ongoing depression which he rates at a 7 at the time of this assessment. Patient denies any SA use besides alcohol with last reported use on 03/08/15 where he reports he had "a couple shots." Patient is prescribed10 mg percocet x 3 daily for back issues but denies any abuse currently stating he is out of that medication. Patient denies any history of MH or any past use of MH medications but is open to medication interventions to assist with sleep and depression. Collateral information obtained from parents who were present, stated patient has been depressed but they feel due to his strong faith would never harm himself. Patient denies any thoughts of self harm and was offered an [facility] but declined stating he was going to stay with his parents the next few days. Case was staffed with Earlene Plateravis NP who agreed patient did not meet inpatient criteria and suggested patient could be prescribed Trazodone 50 at night followed with Vistaril 25 every six hours as needed upon discharge. This Clinical research associatewriter contacted Ray M.D. At [facility]  and informed of Davis's disposition. Patient was also given follow up information to [facility] for follow up appointment for medication management and therapy. Ava Andria MeuseStevens also [facility] Baruch MerlEric Foushee to have an outpatient coordinator contact patient for follow up care.      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 No pertinent past medical history.  Past Surgical History  Procedure Laterality Date  . Back surgery      Family History: No family history on file.  Social History:  reports that he has been smoking Cigarettes.  He has been smoking about 1.00 pack per day. He does not have any smokeless tobacco history on file. He reports that he drinks alcohol. He reports that he uses illicit drugs (Marijuana).  Additional Social History:  Alcohol / Drug Use Pain Medications: See MAR Prescriptions: See MAR Over the Counter: See MAR History of alcohol / drug use?: Yes Longest period of sobriety (when/how long): three months 2015 Substance #1 Name of Substance 1: Alcohol  1 - Age of First Use: 18 1 - Amount (size/oz): 2 oz. of liquor 1 - Frequency: two to three times a week 1 - Duration: last two years 1 - Last Use / Amount: 03/08/15 patient admitts to using over 4 oz. of alcohol  CIWA: CIWA-Ar BP: 128/84 mmHg Pulse Rate: 90 COWS:    PATIENT STRENGTHS: (choose at least two) Ability for insight Average or above average intelligence General fund of knowledge Motivation for treatment/growth Supportive family/friends  Allergies: No Known Allergies  Home Medications:  (Not in a hospital admission)  OB/GYN Status:  No LMP for male patient.  General Assessment Data Location of Assessment:  (Med Center) TTS Assessment: In system Is this a Tele or Face-to-Face  Assessment?: Tele Assessment Is this an Initial Assessment or a Re-assessment for this encounter?: Initial Assessment Marital status: Single Maiden name: na Is patient pregnant?: No Pregnancy Status: No Living Arrangements: Alone Can pt return to current living arrangement?: Yes Admission Status: Voluntary Is patient capable of signing voluntary admission?: Yes Referral Source: Self/Family/Friend Insurance type: Hotel manager Exam [facility] Walk-in ONLY) Medical Exam completed: Yes  Crisis Care Plan Living Arrangements: Alone Legal Guardian: Other: (none) Name of Psychiatrist: None Name of Therapist: None  Education Status Is patient currently in school?: No Current Grade: na Highest grade of school patient has completed: 12 Name of school: [employer] Contact person: na  Risk to self with the past 6 months Suicidal Ideation: No Has patient been a risk to self within the past 6 months prior to admission? : No Suicidal Intent: No Has patient had any suicidal intent within the past 6 months prior to admission? : No Is patient at risk for suicide?: No Suicidal Plan?: No Has patient had any suicidal plan within the past 6 months prior to admission? : No Access to Means: No What has been your use of drugs/alcohol within the last 12 months?: Last use 03/08/15 Previous Attempts/Gestures: No How many times?: 0 Other Self Harm Risks: none Triggers for Past Attempts: Other (Comment) (denies) Intentional Self Injurious Behavior: None Family Suicide History: No Recent stressful life event(s): Loss (Comment) (relationship issues) Persecutory voices/beliefs?: No Depression: Yes Depression Symptoms: Despondent, Insomnia Substance abuse history and/or treatment for substance abuse?: No Suicide prevention information given to non-admitted patients: Not applicable  Risk to Others within the past 6 months Homicidal Ideation: No Does patient have any lifetime risk of violence  toward others beyond the six months prior to admission? : No Thoughts of Harm to Others: No Current Homicidal Intent: No Current Homicidal Plan: No Access to Homicidal Means: No Identified Victim: na History of harm to others?: No Assessment of Violence: None Noted Violent Behavior Description: na Does patient have access to weapons?: No Criminal Charges Pending?: No Does patient have a court date: No Is patient on probation?: No  Psychosis Hallucinations: None noted Delusions: None noted  Mental Status Report Appearance/Hygiene: Unremarkable Eye Contact: Good Motor Activity: Unremarkable Speech: Unremarkable Level of Consciousness: Alert Mood: Depressed Affect: Depressed Anxiety Level: Minimal Thought Processes: Coherent, Relevant Judgement: Unimpaired Orientation: Person, Place, Time Obsessive Compulsive Thoughts/Behaviors: None  Cognitive Functioning Concentration: Normal Memory: Recent Intact, Remote Intact IQ: Average Insight: Good Impulse Control: Good Appetite: Fair Weight Loss: 0 Weight Gain: 0 Sleep: No Change Total Hours of Sleep: 4 Vegetative Symptoms: None  ADLScreening [facility] Assessment Services) Patient's cognitive ability adequate to safely complete daily activities?: Yes Patient able to express need for assistance with ADLs?: Yes Independently performs ADLs?: Yes (  appropriate for developmental age)  Prior Inpatient Therapy Prior Inpatient Therapy: No Prior Therapy Dates: na Prior Therapy Facilty/Provider(s): none Reason for Treatment: na  Prior Outpatient Therapy Prior Outpatient Therapy: No Prior Therapy Dates: na Prior Therapy Facilty/Provider(s): none Reason for Treatment: na Does patient have an ACCT team?: No Does patient have Intensive In-House Services?  : No Does patient have [facility] services? : No Does patient have P4CC services?: No  ADL Screening (condition at time of admission) Patient's cognitive ability adequate to safely  complete daily activities?: Yes Is the patient deaf or have difficulty hearing?: No Does the patient have difficulty seeing, even when wearing glasses/contacts?: No Does the patient have difficulty concentrating, remembering, or making decisions?: No Patient able to express need for assistance with ADLs?: Yes Does the patient have difficulty dressing or bathing?: No Independently performs ADLs?: Yes (appropriate for developmental age) Does the patient have difficulty walking or climbing stairs?: No Weakness of Legs: None Weakness of Arms/Hands: None  Home Assistive Devices/Equipment Home Assistive Devices/Equipment: None  Therapy Consults (therapy consults require a physician order) PT Evaluation Needed: No OT Evalulation Needed: No SLP Evaluation Needed: No Abuse/Neglect Assessment (Assessment to be complete while patient is alone) Physical Abuse: Denies Verbal Abuse: Denies Sexual Abuse: Denies Exploitation of patient/patient's resources: Denies Self-Neglect: Denies Values / Beliefs Cultural Requests During Hospitalization: None Spiritual Requests During Hospitalization: None Consults Spiritual Care Consult Needed: No Social Work Consult Needed: No Merchant navy officer (For Healthcare) Does patient have an advance directive?: No    Additional Information 1:1 In Past 12 Months?: No CIRT Risk: No Elopement Risk: No Does patient have medical clearance?: Yes     Disposition: Case was staffed with Earlene Plater NP who agreed patient did not meet inpatient criteria and suggested patient could be prescribed Trazodone 50 at night followed with Vistaril 25 every six hours as needed upon discharge. This Clinical research associate contacted Ray M.D. At [facility] and informed of Davis's disposition. Patient was also given follow up information to contact [facility] outpatient for follow up appointment for medication management and therapy. Ava Andria Meuse also contacted [facility] outpatient Baruch Merl to have an  outpatient coordinator contact patient for follow up care.      Disposition Initial Assessment Completed for this Encounter: Yes Disposition of Patient: Referred to [facility]) Patient referred to: Other (Comment) [facility])  Alfredia Ferguson 03/10/2015 5:34 PM

## 2015-03-10 NOTE — ED Notes (Signed)
Patient given back all personal items in the 3 bags.  Bags opened by patient in room

## 2015-03-10 NOTE — ED Notes (Signed)
Taking urine sample to lab for holding.

## 2015-03-10 NOTE — ED Notes (Signed)
Patient stable and ambulatory.  Patient verbalizes understanding of discharge medications, instructions and follow-up. 

## 2015-03-10 NOTE — ED Notes (Signed)
Psychiatric on TTS.  He is requesting parents to be present at this time.  Parents escorted to room with patient.

## 2015-10-16 DIAGNOSIS — R112 Nausea with vomiting, unspecified: Secondary | ICD-10-CM | POA: Diagnosis not present

## 2016-01-09 DIAGNOSIS — M5442 Lumbago with sciatica, left side: Secondary | ICD-10-CM | POA: Diagnosis not present

## 2016-01-09 DIAGNOSIS — M961 Postlaminectomy syndrome, not elsewhere classified: Secondary | ICD-10-CM | POA: Diagnosis not present

## 2016-01-09 DIAGNOSIS — M5441 Lumbago with sciatica, right side: Secondary | ICD-10-CM | POA: Diagnosis not present

## 2016-01-09 DIAGNOSIS — Z79891 Long term (current) use of opiate analgesic: Secondary | ICD-10-CM | POA: Diagnosis not present

## 2016-04-22 DIAGNOSIS — Z79891 Long term (current) use of opiate analgesic: Secondary | ICD-10-CM | POA: Diagnosis not present

## 2016-04-22 DIAGNOSIS — M5442 Lumbago with sciatica, left side: Secondary | ICD-10-CM | POA: Diagnosis not present

## 2016-04-22 DIAGNOSIS — G894 Chronic pain syndrome: Secondary | ICD-10-CM | POA: Diagnosis not present

## 2016-04-22 DIAGNOSIS — M961 Postlaminectomy syndrome, not elsewhere classified: Secondary | ICD-10-CM | POA: Diagnosis not present

## 2016-08-10 DIAGNOSIS — M5442 Lumbago with sciatica, left side: Secondary | ICD-10-CM | POA: Diagnosis not present

## 2016-08-10 DIAGNOSIS — G8929 Other chronic pain: Secondary | ICD-10-CM | POA: Diagnosis not present

## 2016-08-10 DIAGNOSIS — G894 Chronic pain syndrome: Secondary | ICD-10-CM | POA: Diagnosis not present

## 2016-08-10 DIAGNOSIS — M5441 Lumbago with sciatica, right side: Secondary | ICD-10-CM | POA: Diagnosis not present

## 2016-11-12 DIAGNOSIS — M961 Postlaminectomy syndrome, not elsewhere classified: Secondary | ICD-10-CM | POA: Diagnosis not present

## 2017-01-05 DIAGNOSIS — H35412 Lattice degeneration of retina, left eye: Secondary | ICD-10-CM | POA: Diagnosis not present

## 2017-01-05 DIAGNOSIS — H5213 Myopia, bilateral: Secondary | ICD-10-CM | POA: Diagnosis not present

## 2017-01-05 DIAGNOSIS — H18823 Corneal disorder due to contact lens, bilateral: Secondary | ICD-10-CM | POA: Diagnosis not present

## 2017-01-05 DIAGNOSIS — Z973 Presence of spectacles and contact lenses: Secondary | ICD-10-CM | POA: Diagnosis not present

## 2017-04-22 DIAGNOSIS — Z79891 Long term (current) use of opiate analgesic: Secondary | ICD-10-CM | POA: Diagnosis not present

## 2017-04-22 DIAGNOSIS — G894 Chronic pain syndrome: Secondary | ICD-10-CM | POA: Diagnosis not present

## 2017-08-03 DIAGNOSIS — Z79891 Long term (current) use of opiate analgesic: Secondary | ICD-10-CM | POA: Diagnosis not present

## 2017-08-03 DIAGNOSIS — M5416 Radiculopathy, lumbar region: Secondary | ICD-10-CM | POA: Diagnosis not present

## 2017-09-12 DIAGNOSIS — S20212A Contusion of left front wall of thorax, initial encounter: Secondary | ICD-10-CM | POA: Diagnosis not present

## 2017-09-12 DIAGNOSIS — R0781 Pleurodynia: Secondary | ICD-10-CM | POA: Diagnosis not present

## 2017-11-02 DIAGNOSIS — G894 Chronic pain syndrome: Secondary | ICD-10-CM | POA: Diagnosis not present

## 2017-11-02 DIAGNOSIS — Z79899 Other long term (current) drug therapy: Secondary | ICD-10-CM | POA: Diagnosis not present

## 2018-02-15 DIAGNOSIS — G894 Chronic pain syndrome: Secondary | ICD-10-CM | POA: Diagnosis not present

## 2018-02-15 DIAGNOSIS — M5136 Other intervertebral disc degeneration, lumbar region: Secondary | ICD-10-CM | POA: Diagnosis not present

## 2018-02-15 DIAGNOSIS — M545 Low back pain: Secondary | ICD-10-CM | POA: Diagnosis not present

## 2018-11-20 DIAGNOSIS — Z79899 Other long term (current) drug therapy: Secondary | ICD-10-CM | POA: Diagnosis not present

## 2019-01-08 DIAGNOSIS — F411 Generalized anxiety disorder: Secondary | ICD-10-CM | POA: Diagnosis not present

## 2019-01-08 DIAGNOSIS — F5101 Primary insomnia: Secondary | ICD-10-CM | POA: Diagnosis not present

## 2019-01-08 DIAGNOSIS — R5383 Other fatigue: Secondary | ICD-10-CM | POA: Diagnosis not present

## 2019-01-08 DIAGNOSIS — F332 Major depressive disorder, recurrent severe without psychotic features: Secondary | ICD-10-CM | POA: Diagnosis not present

## 2019-04-30 DIAGNOSIS — Z79899 Other long term (current) drug therapy: Secondary | ICD-10-CM | POA: Diagnosis not present

## 2019-08-27 DIAGNOSIS — M5136 Other intervertebral disc degeneration, lumbar region: Secondary | ICD-10-CM | POA: Diagnosis not present

## 2019-08-27 DIAGNOSIS — Z79899 Other long term (current) drug therapy: Secondary | ICD-10-CM | POA: Diagnosis not present

## 2019-08-27 DIAGNOSIS — Z5181 Encounter for therapeutic drug level monitoring: Secondary | ICD-10-CM | POA: Diagnosis not present

## 2019-08-27 DIAGNOSIS — Z79891 Long term (current) use of opiate analgesic: Secondary | ICD-10-CM | POA: Diagnosis not present

## 2019-08-27 DIAGNOSIS — M961 Postlaminectomy syndrome, not elsewhere classified: Secondary | ICD-10-CM | POA: Diagnosis not present

## 2019-10-17 DIAGNOSIS — F3341 Major depressive disorder, recurrent, in partial remission: Secondary | ICD-10-CM | POA: Diagnosis not present

## 2019-10-17 DIAGNOSIS — F411 Generalized anxiety disorder: Secondary | ICD-10-CM | POA: Diagnosis not present

## 2019-11-13 DIAGNOSIS — F332 Major depressive disorder, recurrent severe without psychotic features: Secondary | ICD-10-CM | POA: Diagnosis not present

## 2019-12-20 DIAGNOSIS — H5213 Myopia, bilateral: Secondary | ICD-10-CM | POA: Diagnosis not present

## 2019-12-20 DIAGNOSIS — H35412 Lattice degeneration of retina, left eye: Secondary | ICD-10-CM | POA: Diagnosis not present

## 2020-02-22 DIAGNOSIS — M5136 Other intervertebral disc degeneration, lumbar region: Secondary | ICD-10-CM | POA: Diagnosis not present

## 2020-05-05 DIAGNOSIS — F4323 Adjustment disorder with mixed anxiety and depressed mood: Secondary | ICD-10-CM | POA: Diagnosis not present

## 2020-05-30 DIAGNOSIS — U071 COVID-19: Secondary | ICD-10-CM | POA: Diagnosis not present

## 2020-05-30 DIAGNOSIS — Z20822 Contact with and (suspected) exposure to covid-19: Secondary | ICD-10-CM | POA: Diagnosis not present

## 2020-06-27 DIAGNOSIS — Z79891 Long term (current) use of opiate analgesic: Secondary | ICD-10-CM | POA: Diagnosis not present

## 2020-12-29 DIAGNOSIS — G894 Chronic pain syndrome: Secondary | ICD-10-CM | POA: Diagnosis not present

## 2020-12-29 DIAGNOSIS — Z79899 Other long term (current) drug therapy: Secondary | ICD-10-CM | POA: Diagnosis not present

## 2020-12-29 DIAGNOSIS — Z79891 Long term (current) use of opiate analgesic: Secondary | ICD-10-CM | POA: Diagnosis not present

## 2020-12-29 DIAGNOSIS — Z5181 Encounter for therapeutic drug level monitoring: Secondary | ICD-10-CM | POA: Diagnosis not present

## 2021-04-11 DIAGNOSIS — M5136 Other intervertebral disc degeneration, lumbar region: Secondary | ICD-10-CM | POA: Diagnosis not present

## 2021-04-11 DIAGNOSIS — Z79891 Long term (current) use of opiate analgesic: Secondary | ICD-10-CM | POA: Diagnosis not present

## 2021-04-11 DIAGNOSIS — M961 Postlaminectomy syndrome, not elsewhere classified: Secondary | ICD-10-CM | POA: Diagnosis not present

## 2021-04-11 DIAGNOSIS — G894 Chronic pain syndrome: Secondary | ICD-10-CM | POA: Diagnosis not present

## 2021-05-30 DIAGNOSIS — G894 Chronic pain syndrome: Secondary | ICD-10-CM | POA: Diagnosis not present

## 2021-05-30 DIAGNOSIS — M5459 Other low back pain: Secondary | ICD-10-CM | POA: Diagnosis not present

## 2021-05-30 DIAGNOSIS — M961 Postlaminectomy syndrome, not elsewhere classified: Secondary | ICD-10-CM | POA: Diagnosis not present

## 2021-06-09 DIAGNOSIS — M5416 Radiculopathy, lumbar region: Secondary | ICD-10-CM | POA: Diagnosis not present

## 2021-06-16 ENCOUNTER — Other Ambulatory Visit (HOSPITAL_COMMUNITY): Payer: Self-pay | Admitting: Physical Medicine and Rehabilitation

## 2021-06-16 ENCOUNTER — Other Ambulatory Visit: Payer: Self-pay | Admitting: Physical Medicine and Rehabilitation

## 2021-06-16 DIAGNOSIS — M5136 Other intervertebral disc degeneration, lumbar region: Secondary | ICD-10-CM

## 2021-07-07 ENCOUNTER — Other Ambulatory Visit (HOSPITAL_COMMUNITY): Payer: Self-pay | Admitting: Physical Medicine and Rehabilitation

## 2021-07-07 ENCOUNTER — Encounter (HOSPITAL_COMMUNITY)
Admission: RE | Admit: 2021-07-07 | Discharge: 2021-07-07 | Disposition: A | Discharge status: Home / Self Care | Payer: BC Managed Care – PPO | Source: Ambulatory Visit | Attending: Physical Medicine and Rehabilitation | Admitting: Physical Medicine and Rehabilitation

## 2021-07-07 DIAGNOSIS — M5136 Other intervertebral disc degeneration, lumbar region: Secondary | ICD-10-CM

## 2021-07-07 DIAGNOSIS — Z0389 Encounter for observation for other suspected diseases and conditions ruled out: Secondary | ICD-10-CM | POA: Diagnosis not present

## 2021-07-07 MED ORDER — TECHNETIUM TC 99M MEDRONATE IV KIT
20.0000 | PACK | Freq: Once | INTRAVENOUS | Status: AC | PRN
  Administered 2021-07-07: 20 via INTRAVENOUS

## 2021-08-05 DIAGNOSIS — M5416 Radiculopathy, lumbar region: Secondary | ICD-10-CM | POA: Diagnosis not present

## 2021-11-16 DIAGNOSIS — Z79899 Other long term (current) drug therapy: Secondary | ICD-10-CM | POA: Diagnosis not present

## 2021-11-16 DIAGNOSIS — M5136 Other intervertebral disc degeneration, lumbar region: Secondary | ICD-10-CM | POA: Diagnosis not present

## 2021-11-16 DIAGNOSIS — M961 Postlaminectomy syndrome, not elsewhere classified: Secondary | ICD-10-CM | POA: Diagnosis not present

## 2021-11-16 DIAGNOSIS — Z5181 Encounter for therapeutic drug level monitoring: Secondary | ICD-10-CM | POA: Diagnosis not present

## 2021-11-16 DIAGNOSIS — G894 Chronic pain syndrome: Secondary | ICD-10-CM | POA: Diagnosis not present

## 2021-11-16 DIAGNOSIS — Z79891 Long term (current) use of opiate analgesic: Secondary | ICD-10-CM | POA: Diagnosis not present

## 2021-12-08 DIAGNOSIS — M5459 Other low back pain: Secondary | ICD-10-CM | POA: Diagnosis not present

## 2021-12-08 DIAGNOSIS — M5451 Vertebrogenic low back pain: Secondary | ICD-10-CM | POA: Diagnosis not present

## 2021-12-14 ENCOUNTER — Ambulatory Visit
Admission: RE | Admit: 2021-12-14 | Discharge: 2021-12-14 | Disposition: A | Discharge status: Home / Self Care | Payer: BC Managed Care – PPO | Source: Ambulatory Visit | Attending: Family Medicine | Admitting: Family Medicine

## 2021-12-14 ENCOUNTER — Other Ambulatory Visit: Payer: Self-pay | Admitting: Family Medicine

## 2021-12-14 DIAGNOSIS — M545 Low back pain, unspecified: Secondary | ICD-10-CM | POA: Diagnosis not present

## 2021-12-14 DIAGNOSIS — Z01818 Encounter for other preprocedural examination: Secondary | ICD-10-CM

## 2021-12-14 DIAGNOSIS — M5451 Vertebrogenic low back pain: Secondary | ICD-10-CM | POA: Diagnosis not present

## 2021-12-14 DIAGNOSIS — M5416 Radiculopathy, lumbar region: Secondary | ICD-10-CM | POA: Diagnosis not present

## 2021-12-21 ENCOUNTER — Ambulatory Visit: Payer: Self-pay | Admitting: Orthopedic Surgery

## 2021-12-21 DIAGNOSIS — M5126 Other intervertebral disc displacement, lumbar region: Secondary | ICD-10-CM

## 2021-12-21 NOTE — H&P (View-Only) (Signed)
[name] is an 41 y.o. male.   Chief Complaint: back and R leg pain HPI: Reason for Visit: (normal) review of test results (lumbar MRI) Context: 1.5 months out from onset Location (Lower Extremity): lower back pain on the right Severity: pain level 10/10; severe Quality: sharp; aching Are you working? not at all; OOW Medications: oxycodone from pain management Predominately having buttock and leg pain weakness into the leg. Has a history of manageable back pain  No past medical history on file.  Past Surgical History:  Procedure Laterality Date   BACK SURGERY      No family history on file. Social History:  reports that he has been smoking cigarettes. He has been smoking an average of 1 pack per day. He does not have any smokeless tobacco history on file. He reports current alcohol use. He reports current drug use. Drug: Marijuana.  Allergies: No Known Allergies  Current meds: Excedrin Extra Strength oxyCODONE-acetaminophen 10 mg-325 mg tablet predniSONE 5 mg tablets in a dose pack  Review of Systems  Constitutional: Negative.   HENT: Negative.    Eyes: Negative.   Respiratory: Negative.    Cardiovascular: Negative.   Gastrointestinal: Negative.   Endocrine: Negative.   Genitourinary: Negative.   Musculoskeletal:  Positive for back pain and gait problem.  Skin: Negative.   Neurological:  Positive for weakness and numbness.  Psychiatric/Behavioral: Negative.      There were no vitals taken for this visit. Physical Exam Constitutional:      Appearance: Normal appearance.  HENT:     Head: Normocephalic and atraumatic.     Right Ear: External ear normal.     Left Ear: External ear normal.     Nose: Nose normal.     Mouth/Throat:     Pharynx: Oropharynx is clear.  Eyes:     Conjunctiva/sclera: Conjunctivae normal.  Cardiovascular:     Rate and Rhythm: Normal rate and regular rhythm.     Pulses: Normal pulses.     Heart sounds: Normal heart sounds.   Pulmonary:     Effort: Pulmonary effort is normal.     Breath sounds: Normal breath sounds.  Abdominal:     General: Bowel sounds are normal.  Musculoskeletal:     Cervical back: Normal range of motion.     Comments: Gait and Station: Appearance: ambulating with no assistive devices and antalgic gait.  Constitutional: General Appearance: healthy-appearing and distress (mild).  Psychiatric: Mood and Affect: active and alert.  Cardiovascular System: Edema Right: none; Dorsalis and posterior tibial pulses 2+. Edema Left: none.  Abdomen: Inspection and Palpation: non-distended and no tenderness.  Skin: Inspection and palpation: no rash.  Lumbar Spine: Inspection: normal alignment. Bony Palpation of the Lumbar Spine: tender at lumbosacral junction.. Bony Palpation of the Right Hip: no tenderness of the greater trochanter and tenderness of the SI joint; Pelvis stable. Bony Palpation of the Left Hip: no tenderness of the greater trochanter and tenderness of the SI joint. Soft Tissue Palpation on the Right: No flank pain with percussion. Active Range of Motion: limited flexion and extention.  Motor Strength: L1 Motor Strength on the Right: hip flexion iliopsoas 5/5. L1 Motor Strength on the Left: hip flexion iliopsoas 5/5. L2-L4 Motor Strength on the Right: knee extension quadriceps 5/5. L2-L4 Motor Strength on the Left: knee extension quadriceps 5/5. L5 Motor Strength on the Right: ankle dorsiflexion tibialis anterior 5/5 and great toe extension extensor hallucis longus 4/5. L5 Motor Strength on the Left: ankle   dorsiflexion tibialis anterior 5/5 and great toe extension extensor hallucis longus 5/5. S1 Motor Strength on the Right: plantar flexion gastrocnemius 5/5. S1 Motor Strength on the Left: plantar flexion gastrocnemius 5/5.  Neurological System: Knee Reflex Right: normal (2). Knee Reflex Left: normal (2). Ankle Reflex Right: diminished (1). Ankle Reflex Left: normal (2). Babinski Reflex Right:  plantar reflex absent. Babinski Reflex Left: plantar reflex absent. Sensation on the Right: decreased sensation on the lateral leg and dorsum of the foot (L5) and dereased sensation on the sole of the foot and the posterior leg (S1) and normal distal extremities. Sensation on the Left: normal distal extremities. Special Tests on the Right: no clonus of the ankle/knee and seated straight leg raising test positive. Special Tests on the Left: no clonus of the ankle/knee.  Skin:    General: Skin is warm and dry.  Neurological:     General: No focal deficit present.     Mental Status: He is alert.    MRI lumbar spine demonstrates disc herniation L5-S1 to the right compressing the S1 nerve root. Some disc degeneration is noted at L4-5 L5-S1. Appears to be a previous laminotomy defect L5-S1 to the right.  Assessment/Plan Impression:  1. 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 Tobacco dependence  Plan:  Discussed options living with his symptoms pain management etc. versus revision microdiscectomy L5-S1 with or without fusion. We discussed the risk and benefits of all treatment algorithms. Patient would like to proceed with the revision discectomy. I had an extensive discussion with the patient concerning the pathology relevant anatomy and treatment options. At this point exhausting conservative treatment and in the presence of a neurologic deficit we discussed microlumbar decompression. I discussed the risks and benefits including bleeding, infection, DVT, PE, anesthetic complications, worsening in their symptoms, improvement in their symptoms, C SF leakage, epidural fibrosis, need for future surgeries such as revision discectomy and lumbar fusion. I also indicated that this is an operation to basically decompress the nerve roots to allow recovery as opposed to fixing a herniated disc if it is  encountered and that the incidence of recurrent chest disc herniation can approach 15%. Also that nerve root recovery is variable and may not recover completely. Any ligament or bone that is contributing to compressing the nerves will be removed as well.  I discussed the operative course including overnight in the hospital. Immediate ambulation. Follow-up in 2 weeks for suture removal. 6 weeks until healing of the herniation and surgical incision followed by 6 weeks of reconditioning and strengthening of the core musculature. Also discussed the need to employ the concepts of disc pressure management and core motion following the surgery to minimize the risk of recurrent disc herniation. We will obtain preoperative clearance i if necessary and proceed accordingly.  We discussed tobacco cessation requirement perioperatively to reduce the risk of epidural fibrosis recurrent disc herniation infection etc.  Given that this is a revision will be an increased risk of CSF leakage recurrent disc herniation epidural fibrosis etc.  We discussed concomitant fusion though predominantly his nerve pain his last surgery was 18 years ago. It is a reasonable period of time with stability. We discussed possibly requiring that if recurrent disc herniation is noted progressive back pain  Patient is here with his wife and father.  No history of MRSA or DVT.  Addendum:  I had a discussion with the radiologist concerning the MRI. They do feel that there   was possibly a laminotomy defect on the right L5-S1. Either a nerve root sheath dilation or cyst less likely a facet cyst on the right.  Plan Revision microdiscectomy L5-S1 right  Cecilie Kicks, PA-C for Dr Tonita Cong 12/21/2021, 3:41 PM

## 2021-12-21 NOTE — H&P (Signed)
[name] is an 41 y.o. male.   Chief Complaint: back and R leg pain HPI: Reason for Visit: (normal) review of test results (lumbar MRI) Context: 1.5 months out from onset Location (Lower Extremity): lower back pain on the right Severity: pain level 10/10; severe Quality: sharp; aching Are you working? not at all; OOW Medications: oxycodone from pain management Predominately having buttock and leg pain weakness into the leg. Has a history of manageable back pain  No past medical history on file.  Past Surgical History:  Procedure Laterality Date   BACK SURGERY      No family history on file. Social History:  reports that he has been smoking cigarettes. He has been smoking an average of 1 pack per day. He does not have any smokeless tobacco history on file. He reports current alcohol use. He reports current drug use. Drug: Marijuana.  Allergies: No Known Allergies  Current meds: Excedrin Extra Strength oxyCODONE-acetaminophen 10 mg-325 mg tablet predniSONE 5 mg tablets in a dose pack  Review of Systems  Constitutional: Negative.   HENT: Negative.    Eyes: Negative.   Respiratory: Negative.    Cardiovascular: Negative.   Gastrointestinal: Negative.   Endocrine: Negative.   Genitourinary: Negative.   Musculoskeletal:  Positive for back pain and gait problem.  Skin: Negative.   Neurological:  Positive for weakness and numbness.  Psychiatric/Behavioral: Negative.      There were no vitals taken for this visit. Physical Exam Constitutional:      Appearance: Normal appearance.  HENT:     Head: Normocephalic and atraumatic.     Right Ear: External ear normal.     Left Ear: External ear normal.     Nose: Nose normal.     Mouth/Throat:     Pharynx: Oropharynx is clear.  Eyes:     Conjunctiva/sclera: Conjunctivae normal.  Cardiovascular:     Rate and Rhythm: Normal rate and regular rhythm.     Pulses: Normal pulses.     Heart sounds: Normal heart sounds.   Pulmonary:     Effort: Pulmonary effort is normal.     Breath sounds: Normal breath sounds.  Abdominal:     General: Bowel sounds are normal.  Musculoskeletal:     Cervical back: Normal range of motion.     Comments: Gait and Station: Appearance: ambulating with no assistive devices and antalgic gait.  Constitutional: General Appearance: healthy-appearing and distress (mild).  Psychiatric: Mood and Affect: active and alert.  Cardiovascular System: Edema Right: none; Dorsalis and posterior tibial pulses 2+. Edema Left: none.  Abdomen: Inspection and Palpation: non-distended and no tenderness.  Skin: Inspection and palpation: no rash.  Lumbar Spine: Inspection: normal alignment. Bony Palpation of the Lumbar Spine: tender at lumbosacral junction.. Bony Palpation of the Right Hip: no tenderness of the greater trochanter and tenderness of the SI joint; Pelvis stable. Bony Palpation of the Left Hip: no tenderness of the greater trochanter and tenderness of the SI joint. Soft Tissue Palpation on the Right: No flank pain with percussion. Active Range of Motion: limited flexion and extention.  Motor Strength: L1 Motor Strength on the Right: hip flexion iliopsoas 5/5. L1 Motor Strength on the Left: hip flexion iliopsoas 5/5. L2-L4 Motor Strength on the Right: knee extension quadriceps 5/5. L2-L4 Motor Strength on the Left: knee extension quadriceps 5/5. L5 Motor Strength on the Right: ankle dorsiflexion tibialis anterior 5/5 and great toe extension extensor hallucis longus 4/5. L5 Motor Strength on the Left: ankle  dorsiflexion tibialis anterior 5/5 and great toe extension extensor hallucis longus 5/5. S1 Motor Strength on the Right: plantar flexion gastrocnemius 5/5. S1 Motor Strength on the Left: plantar flexion gastrocnemius 5/5.  Neurological System: Knee Reflex Right: normal (2). Knee Reflex Left: normal (2). Ankle Reflex Right: diminished (1). Ankle Reflex Left: normal (2). Babinski Reflex Right:  plantar reflex absent. Babinski Reflex Left: plantar reflex absent. Sensation on the Right: decreased sensation on the lateral leg and dorsum of the foot (L5) and dereased sensation on the sole of the foot and the posterior leg (S1) and normal distal extremities. Sensation on the Left: normal distal extremities. Special Tests on the Right: no clonus of the ankle/knee and seated straight leg raising test positive. Special Tests on the Left: no clonus of the ankle/knee.  Skin:    General: Skin is warm and dry.  Neurological:     General: No focal deficit present.     Mental Status: He is alert.    MRI lumbar spine demonstrates disc herniation L5-S1 to the right compressing the S1 nerve root. Some disc degeneration is noted at L4-5 L5-S1. Appears to be a previous laminotomy defect L5-S1 to the right.  Assessment/Plan Impression:  1. 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 Tobacco dependence  Plan:  Discussed options living with his symptoms pain management etc. versus revision microdiscectomy L5-S1 with or without fusion. We discussed the risk and benefits of all treatment algorithms. Patient would like to proceed with the revision discectomy. I had an extensive discussion with the patient concerning the pathology relevant anatomy and treatment options. At this point exhausting conservative treatment and in the presence of a neurologic deficit we discussed microlumbar decompression. I discussed the risks and benefits including bleeding, infection, DVT, PE, anesthetic complications, worsening in their symptoms, improvement in their symptoms, C SF leakage, epidural fibrosis, need for future surgeries such as revision discectomy and lumbar fusion. I also indicated that this is an operation to basically decompress the nerve roots to allow recovery as opposed to fixing a herniated disc if it is  encountered and that the incidence of recurrent chest disc herniation can approach 15%. Also that nerve root recovery is variable and may not recover completely. Any ligament or bone that is contributing to compressing the nerves will be removed as well.  I discussed the operative course including overnight in the hospital. Immediate ambulation. Follow-up in 2 weeks for suture removal. 6 weeks until healing of the herniation and surgical incision followed by 6 weeks of reconditioning and strengthening of the core musculature. Also discussed the need to employ the concepts of disc pressure management and core motion following the surgery to minimize the risk of recurrent disc herniation. We will obtain preoperative clearance i if necessary and proceed accordingly.  We discussed tobacco cessation requirement perioperatively to reduce the risk of epidural fibrosis recurrent disc herniation infection etc.  Given that this is a revision will be an increased risk of CSF leakage recurrent disc herniation epidural fibrosis etc.  We discussed concomitant fusion though predominantly his nerve pain his last surgery was 18 years ago. It is a reasonable period of time with stability. We discussed possibly requiring that if recurrent disc herniation is noted progressive back pain  Patient is here with his wife and father.  No history of MRSA or DVT.  Addendum:  I had a discussion with the radiologist concerning the MRI. They do feel that there  was possibly a laminotomy defect on the right L5-S1. Either a nerve root sheath dilation or cyst less likely a facet cyst on the right.  Plan Revision microdiscectomy L5-S1 right  Cecilie Kicks, PA-C for Dr Tonita Cong 12/21/2021, 3:41 PM

## 2021-12-28 NOTE — Progress Notes (Signed)
Surgical Instructions    Your procedure is scheduled on Thursday, 01/07/22.  Report to [facility] Main Entrance "A" at 5:30 A.M., then check in with the Admitting office.  Call this number if you have problems the morning of surgery:  [phone]   If you have any questions prior to your surgery date call [phone]: Open Monday-Friday 8am-4pm If you experience any cold or flu symptoms such as cough, fever, chills, shortness of breath, etc. between now and your scheduled surgery, please notify us at the above number     Remember:  Do not eat after midnight the night before your surgery  You may drink clear liquids until 4:30am the morning of your surgery.   Clear liquids allowed are: Water, Non-Citrus Juices (without pulp), Carbonated Beverages, Clear Tea, Black Coffee ONLY (NO MILK, CREAM OR POWDERED CREAMER of any kind), and Gatorade  Patient Instructions  The night before surgery:  No food after midnight. ONLY clear liquids after midnight  The day of surgery (if you do NOT have diabetes):  Drink ONE (1) Pre-Surgery Clear Ensure by 4:30am the morning of surgery. Drink in one sitting. Do not sip.  This drink was given to you during your hospital  pre-op appointment visit. Nothing else to drink after completing the  Pre-Surgery Clear Ensure.           If you have questions, please contact your surgeon's office.     Take these medicines the morning of surgery with A SIP OF WATER:  IF NEEDED: hydrOXYzine (ATARAX/VISTARIL), Oxycodone-Acetaminophen (PERCOCET PO)  As of today, STOP taking any Aspirin (unless otherwise instructed by your surgeon) Aleve, Naproxen, Ibuprofen, Motrin, Advil, Goody's, BC's, all herbal medications, fish oil, and all vitamins.           Do not wear jewelry or makeup. Do not wear lotions, powders, cologne or deodorant. Men may shave face and neck. Do not bring valuables to the hospital. Do not wear nail polish, gel polish, artificial nails, or any  other type of covering on natural nails (fingers and toes) If you have artificial nails or gel coating that need to be removed by a nail salon, please have this removed prior to surgery. Artificial nails or gel coating may interfere with anesthesia's ability to adequately monitor your vital signs.  [facility] is not responsible for any belongings or valuables.    Do NOT Smoke (Tobacco/Vaping)  24 hours prior to your procedure  If you use a CPAP at night, you may bring your mask for your overnight stay.   Contacts, glasses, hearing aids, dentures or partials may not be worn into surgery, please bring cases for these belongings   For patients admitted to the hospital, discharge time will be determined by your treatment team.   Patients discharged the day of surgery will not be allowed to drive home, and someone needs to stay with them for 24 hours.   SURGICAL WAITING ROOM VISITATION Patients having surgery or a procedure may have no more than 2 support people in the waiting area - these visitors may rotate.   Children under the age of 62 must have an adult with them who is not the patient. If the patient needs to stay at the hospital during part of their recovery, the visitor guidelines for inpatient rooms apply. Pre-op nurse will coordinate an appropriate time for 1 support person to accompany patient in pre-op.  This support person may not rotate.   Please refer to [email or web address] for the visitor  guidelines for Inpatients (after your surgery is over and you are in a regular room).    Special instructions:    Oral Hygiene is also important to reduce your risk of infection.  Remember - BRUSH YOUR TEETH THE MORNING OF SURGERY WITH YOUR REGULAR TOOTHPASTE   [place]- Preparing For Surgery  Before surgery, you can play an important role. Because skin is not sterile, your skin needs to be as free of germs as possible. You can reduce  the number of germs on your skin by washing with CHG (chlorahexidine gluconate) Soap before surgery.  CHG is an antiseptic cleaner which kills germs and bonds with the skin to continue killing germs even after washing.     Please do not use if you have an allergy to CHG or antibacterial soaps. If your skin becomes reddened/irritated stop using the CHG.  Do not shave (including legs and underarms) for at least 48 hours prior to first CHG shower. It is OK to shave your face.  Please follow these instructions carefully.     Shower the NIGHT BEFORE SURGERY and the MORNING OF SURGERY with CHG Soap.   If you chose to wash your hair, wash your hair first as usual with your normal shampoo. After you shampoo, rinse your hair and body thoroughly to remove the shampoo.  Then [employer] and genitals (private parts) with your normal soap and rinse thoroughly to remove soap.  After that Use CHG Soap as you would any other liquid soap. You can apply CHG directly to the skin and wash gently with a scrungie or a clean washcloth.   Apply the CHG Soap to your body ONLY FROM THE NECK DOWN.  Do not use on open wounds or open sores. Avoid contact with your eyes, ears, mouth and genitals (private parts). Wash Face and genitals (private parts)  with your normal soap.   Wash thoroughly, paying special attention to the area where your surgery will be performed.  Thoroughly rinse your body with warm water from the neck down.  DO NOT shower/wash with your normal soap after using and rinsing off the CHG Soap.  Pat yourself dry with a CLEAN TOWEL.  Wear CLEAN PAJAMAS to bed the night before surgery  Place CLEAN SHEETS on your bed the night before your surgery  DO NOT SLEEP WITH PETS.   Day of Surgery: Take a shower with CHG soap. Wear Clean/Comfortable clothing the morning of surgery Do not apply any deodorants/lotions.   Remember to brush your teeth WITH YOUR REGULAR TOOTHPASTE.    If you received a COVID  test during your pre-op visit, it is requested that you wear a mask when out in public, stay away from anyone that may not be feeling well, and notify your surgeon if you develop symptoms. If you have been in contact with anyone that has tested positive in the last 10 days, please notify your surgeon.    Please read over the following fact sheets that you were given.

## 2021-12-29 ENCOUNTER — Ambulatory Visit (HOSPITAL_COMMUNITY)
Admission: RE | Admit: 2021-12-29 | Discharge: 2021-12-29 | Disposition: A | Discharge status: Home / Self Care | Payer: BC Managed Care – PPO | Source: Ambulatory Visit | Attending: Orthopedic Surgery | Admitting: Orthopedic Surgery

## 2021-12-29 ENCOUNTER — Encounter (HOSPITAL_COMMUNITY)
Admission: RE | Admit: 2021-12-29 | Discharge: 2021-12-29 | Disposition: A | Discharge status: Home / Self Care | Payer: BC Managed Care – PPO | Source: Ambulatory Visit | Attending: Specialist | Admitting: Specialist

## 2021-12-29 ENCOUNTER — Encounter (HOSPITAL_COMMUNITY): Payer: Self-pay

## 2021-12-29 ENCOUNTER — Other Ambulatory Visit: Payer: Self-pay

## 2021-12-29 VITALS — BP 121/83 | HR 84 | Temp 97.6°F | Resp 18 | Ht 78.0 in | Wt 191.3 lb

## 2021-12-29 DIAGNOSIS — M5146 Schmorl's nodes, lumbar region: Secondary | ICD-10-CM | POA: Insufficient documentation

## 2021-12-29 DIAGNOSIS — M5126 Other intervertebral disc displacement, lumbar region: Secondary | ICD-10-CM

## 2021-12-29 DIAGNOSIS — Z01818 Encounter for other preprocedural examination: Secondary | ICD-10-CM | POA: Insufficient documentation

## 2021-12-29 HISTORY — DX: Headache, unspecified: R51.9

## 2021-12-29 HISTORY — DX: Depression, unspecified: F32.A

## 2021-12-29 LAB — CBC
HCT: 39.8 % (ref 39.0–52.0)
Hemoglobin: 14.2 g/dL (ref 13.0–17.0)
MCH: 33.9 pg (ref 26.0–34.0)
MCHC: 35.7 g/dL (ref 30.0–36.0)
MCV: 95 fL (ref 80.0–100.0)
Platelets: 260 10*3/uL (ref 150–400)
RBC: 4.19 MIL/uL — ABNORMAL LOW (ref 4.22–5.81)
RDW: 11.6 % (ref 11.5–15.5)
WBC: 11.9 10*3/uL — ABNORMAL HIGH (ref 4.0–10.5)
nRBC: 0 % (ref 0.0–0.2)

## 2021-12-29 LAB — SURGICAL PCR SCREEN
MRSA, PCR: NEGATIVE
Staphylococcus aureus: NEGATIVE

## 2021-12-29 NOTE — Progress Notes (Signed)
PCP - Marda Stalker, PA at [facility] Cardiologist - denies  PPM/ICD - n/a Device Orders - n/a Rep Notified - n/a  Chest x-ray - 12/14/21 EKG - n/a Stress Test - denies ECHO - denies Cardiac Cath - denies  Sleep Study - denies CPAP - n/a  Fasting Blood Sugar - n/a Checks Blood Sugar _____ times a day- n/a  Last dose of GLP1 agonist-  n/a GLP1 instructions: n/a  Blood Thinner Instructions: n/a Aspirin Instructions: n/a  ERAS Protcol - Clear liquids until 0430 am day of surgery PRE-SURGERY Ensure or G2- Ensure  COVID TEST- n/a   Anesthesia review: No  Patient denies shortness of breath, fever, cough and chest pain at PAT appointment   All instructions explained to the patient, with a verbal understanding of the material. Patient agrees to go over the instructions while at home for a better understanding. The opportunity to ask questions was provided.

## 2021-12-29 NOTE — Progress Notes (Signed)
Surgical Instructions    Your procedure is scheduled on Thursday, 01/07/22.  Report to [facility] Main Entrance "A" at 5:30 A.M., then check in with the Admitting office.  Call this number if you have problems the morning of surgery:  [phone]   If you have any questions prior to your surgery date call [phone]: Open Monday-Friday 8am-4pm If you experience any cold or flu symptoms such as cough, fever, chills, shortness of breath, etc. between now and your scheduled surgery, please notify us at the above number     Remember:  Do not eat after midnight the night before your surgery  You may drink clear liquids until 4:30am the morning of your surgery.   Clear liquids allowed are: Water, Non-Citrus Juices (without pulp), Carbonated Beverages, Clear Tea, Black Coffee ONLY (NO MILK, CREAM OR POWDERED CREAMER of any kind), and Gatorade  Patient Instructions  The night before surgery:  No food after midnight. ONLY clear liquids after midnight  The day of surgery (if you do NOT have diabetes):  Drink ONE (1) Pre-Surgery Clear Ensure by 4:30am the morning of surgery. Drink in one sitting. Do not sip.  This drink was given to you during your hospital  pre-op appointment visit. Nothing else to drink after completing the  Pre-Surgery Clear Ensure.           If you have questions, please contact your surgeon's office.     Take these medicines the morning of surgery with A SIP OF WATER:   cetirizine (ZYRTEC)- If needed  oxyCODONE-acetaminophen (PERCOCET)- If needed  As of today, STOP taking any Aspirin (unless otherwise instructed by your surgeon) Aleve, Naproxen, Ibuprofen, Motrin, Advil, Goody's, BC's, all herbal medications, fish oil, and all vitamins.           Do not wear jewelry or makeup. Do not wear lotions, powders, cologne or deodorant. Men may shave face and neck. Do not bring valuables to the hospital. Do not wear nail polish, gel polish, artificial nails, or any  other type of covering on natural nails (fingers and toes) If you have artificial nails or gel coating that need to be removed by a nail salon, please have this removed prior to surgery. Artificial nails or gel coating may interfere with anesthesia's ability to adequately monitor your vital signs.  [facility] is not responsible for any belongings or valuables.    Do NOT Smoke (Tobacco/Vaping)  24 hours prior to your procedure  If you use a CPAP at night, you may bring your mask for your overnight stay.   Contacts, glasses, hearing aids, dentures or partials may not be worn into surgery, please bring cases for these belongings   For patients admitted to the hospital, discharge time will be determined by your treatment team.   Patients discharged the day of surgery will not be allowed to drive home, and someone needs to stay with them for 24 hours.   SURGICAL WAITING ROOM VISITATION Patients having surgery or a procedure may have no more than 2 support people in the waiting area - these visitors may rotate.   Children under the age of 68 must have an adult with them who is not the patient. If the patient needs to stay at the hospital during part of their recovery, the visitor guidelines for inpatient rooms apply. Pre-op nurse will coordinate an appropriate time for 1 support person to accompany patient in pre-op.  This support person may not rotate.   Please refer to [email or web address]  for the visitor guidelines for Inpatients (after your surgery is over and you are in a regular room).    Special instructions:    Oral Hygiene is also important to reduce your risk of infection.  Remember - BRUSH YOUR TEETH THE MORNING OF SURGERY WITH YOUR REGULAR TOOTHPASTE   [place]- Preparing For Surgery  Before surgery, you can play an important role. Because skin is not sterile, your skin needs to be as free of germs as possible. You can reduce  the number of germs on your skin by washing with CHG (chlorahexidine gluconate) Soap before surgery.  CHG is an antiseptic cleaner which kills germs and bonds with the skin to continue killing germs even after washing.     Please do not use if you have an allergy to CHG or antibacterial soaps. If your skin becomes reddened/irritated stop using the CHG.  Do not shave (including legs and underarms) for at least 48 hours prior to first CHG shower. It is OK to shave your face.  Please follow these instructions carefully.     Shower the NIGHT BEFORE SURGERY and the MORNING OF SURGERY with CHG Soap.   If you chose to wash your hair, wash your hair first as usual with your normal shampoo. After you shampoo, rinse your hair and body thoroughly to remove the shampoo.  Then [employer] and genitals (private parts) with your normal soap and rinse thoroughly to remove soap.  After that Use CHG Soap as you would any other liquid soap. You can apply CHG directly to the skin and wash gently with a scrungie or a clean washcloth.   Apply the CHG Soap to your body ONLY FROM THE NECK DOWN.  Do not use on open wounds or open sores. Avoid contact with your eyes, ears, mouth and genitals (private parts). Wash Face and genitals (private parts)  with your normal soap.   Wash thoroughly, paying special attention to the area where your surgery will be performed.  Thoroughly rinse your body with warm water from the neck down.  DO NOT shower/wash with your normal soap after using and rinsing off the CHG Soap.  Pat yourself dry with a CLEAN TOWEL.  Wear CLEAN PAJAMAS to bed the night before surgery  Place CLEAN SHEETS on your bed the night before your surgery  DO NOT SLEEP WITH PETS.   Day of Surgery: Take a shower with CHG soap. Wear Clean/Comfortable clothing the morning of surgery Do not apply any deodorants/lotions.   Remember to brush your teeth WITH YOUR REGULAR TOOTHPASTE.    If you received a COVID  test during your pre-op visit, it is requested that you wear a mask when out in public, stay away from anyone that may not be feeling well, and notify your surgeon if you develop symptoms. If you have been in contact with anyone that has tested positive in the last 10 days, please notify your surgeon.    Please read over the following fact sheets that you were given.

## 2022-01-07 ENCOUNTER — Ambulatory Visit (HOSPITAL_COMMUNITY): Payer: BC Managed Care – PPO | Admitting: Certified Registered Nurse Anesthetist

## 2022-01-07 ENCOUNTER — Ambulatory Visit (HOSPITAL_COMMUNITY)
Admission: RE | Admit: 2022-01-07 | Discharge: 2022-01-08 | Disposition: A | Discharge status: Home / Self Care | Payer: BC Managed Care – PPO | Attending: Specialist | Admitting: Specialist

## 2022-01-07 ENCOUNTER — Other Ambulatory Visit: Payer: Self-pay

## 2022-01-07 ENCOUNTER — Ambulatory Visit (HOSPITAL_COMMUNITY): Payer: BC Managed Care – PPO

## 2022-01-07 ENCOUNTER — Encounter (HOSPITAL_COMMUNITY): Payer: Self-pay | Admitting: Specialist

## 2022-01-07 ENCOUNTER — Encounter (HOSPITAL_COMMUNITY)
Admission: RE | Disposition: A | Discharge status: Home / Self Care | Payer: Self-pay | Source: Home / Self Care | Attending: Specialist

## 2022-01-07 DIAGNOSIS — M4327 Fusion of spine, lumbosacral region: Secondary | ICD-10-CM | POA: Diagnosis not present

## 2022-01-07 DIAGNOSIS — M5117 Intervertebral disc disorders with radiculopathy, lumbosacral region: Secondary | ICD-10-CM | POA: Insufficient documentation

## 2022-01-07 DIAGNOSIS — M4807 Spinal stenosis, lumbosacral region: Secondary | ICD-10-CM | POA: Diagnosis not present

## 2022-01-07 DIAGNOSIS — M5116 Intervertebral disc disorders with radiculopathy, lumbar region: Secondary | ICD-10-CM | POA: Diagnosis not present

## 2022-01-07 DIAGNOSIS — M5126 Other intervertebral disc displacement, lumbar region: Secondary | ICD-10-CM | POA: Diagnosis present

## 2022-01-07 DIAGNOSIS — F17200 Nicotine dependence, unspecified, uncomplicated: Secondary | ICD-10-CM | POA: Diagnosis not present

## 2022-01-07 DIAGNOSIS — F17210 Nicotine dependence, cigarettes, uncomplicated: Secondary | ICD-10-CM | POA: Diagnosis not present

## 2022-01-07 DIAGNOSIS — M48062 Spinal stenosis, lumbar region with neurogenic claudication: Secondary | ICD-10-CM | POA: Diagnosis not present

## 2022-01-07 DIAGNOSIS — M5127 Other intervertebral disc displacement, lumbosacral region: Secondary | ICD-10-CM | POA: Diagnosis not present

## 2022-01-07 HISTORY — PX: LUMBAR LAMINECTOMY/DECOMPRESSION MICRODISCECTOMY: SHX5026

## 2022-01-07 SURGERY — LUMBAR LAMINECTOMY/DECOMPRESSION MICRODISCECTOMY 1 LEVEL
Anesthesia: General | Laterality: Right

## 2022-01-07 MED ORDER — DOCUSATE SODIUM 100 MG PO CAPS: 100.0000 mg | ORAL_CAPSULE | Freq: Two times a day (BID) | ORAL | 1 refills | Status: AC | PRN

## 2022-01-07 MED ORDER — TRANEXAMIC ACID-NACL 1000-0.7 MG/100ML-% IV SOLN
1000.0000 mg | INTRAVENOUS | Status: AC
  Administered 2022-01-07: 1000 mg via INTRAVENOUS
  Filled 2022-01-07: qty 100

## 2022-01-07 MED ORDER — PHENYLEPHRINE HCL-NACL 20-0.9 MG/250ML-% IV SOLN
INTRAVENOUS | Status: DC | PRN
  Administered 2022-01-07: 15 ug/min via INTRAVENOUS

## 2022-01-07 MED ORDER — PHENYLEPHRINE HCL (PRESSORS) 10 MG/ML IV SOLN
INTRAVENOUS | Status: DC | PRN
  Administered 2022-01-07: 160 ug via INTRAVENOUS

## 2022-01-07 MED ORDER — OXYCODONE HCL 10 MG PO TABS: 10.0000 mg | ORAL_TABLET | ORAL | 0 refills | Status: AC | PRN

## 2022-01-07 MED ORDER — 0.9 % SODIUM CHLORIDE (POUR BTL) OPTIME
TOPICAL | Status: DC | PRN
  Administered 2022-01-07: 1000 mL

## 2022-01-07 MED ORDER — ACETAMINOPHEN 10 MG/ML IV SOLN
1000.0000 mg | INTRAVENOUS | Status: AC
  Administered 2022-01-07: 1000 mg via INTRAVENOUS
  Filled 2022-01-07: qty 100

## 2022-01-07 MED ORDER — DEXAMETHASONE SODIUM PHOSPHATE 10 MG/ML IJ SOLN
INTRAMUSCULAR | Status: DC | PRN
  Administered 2022-01-07: 10 mg via INTRAVENOUS

## 2022-01-07 MED ORDER — FENTANYL CITRATE (PF) 100 MCG/2ML IJ SOLN
INTRAMUSCULAR | Status: AC
  Filled 2022-01-07: qty 2

## 2022-01-07 MED ORDER — BUPIVACAINE-EPINEPHRINE 0.5% -1:200000 IJ SOLN
INTRAMUSCULAR | Status: DC | PRN
  Administered 2022-01-07: 10 mL

## 2022-01-07 MED ORDER — ONDANSETRON HCL 4 MG/2ML IJ SOLN: 4.0000 mg | Freq: Four times a day (QID) | INTRAMUSCULAR | Status: DC | PRN

## 2022-01-07 MED ORDER — PROPOFOL 10 MG/ML IV BOLUS
INTRAVENOUS | Status: AC
  Filled 2022-01-07: qty 20

## 2022-01-07 MED ORDER — LACTATED RINGERS IV SOLN: INTRAVENOUS | Status: DC

## 2022-01-07 MED ORDER — OXYCODONE HCL 5 MG PO TABS: 5.0000 mg | ORAL_TABLET | ORAL | Status: DC | PRN

## 2022-01-07 MED ORDER — METHOCARBAMOL 500 MG PO TABS
500.0000 mg | ORAL_TABLET | Freq: Four times a day (QID) | ORAL | Status: DC | PRN
  Administered 2022-01-07 – 2022-01-08 (×3): 500 mg via ORAL
  Filled 2022-01-07 (×3): qty 1

## 2022-01-07 MED ORDER — MAGNESIUM CITRATE PO SOLN: 1.0000 | Freq: Once | ORAL | Status: DC | PRN

## 2022-01-07 MED ORDER — HYDROMORPHONE HCL 1 MG/ML IJ SOLN
1.0000 mg | INTRAMUSCULAR | Status: DC | PRN
  Administered 2022-01-07: 1 mg via INTRAVENOUS
  Filled 2022-01-07: qty 1

## 2022-01-07 MED ORDER — THROMBIN 20000 UNITS EX SOLR
CUTANEOUS | Status: AC
  Filled 2022-01-07: qty 20000

## 2022-01-07 MED ORDER — ONDANSETRON HCL 4 MG/2ML IJ SOLN
INTRAMUSCULAR | Status: DC | PRN
  Administered 2022-01-07: 4 mg via INTRAVENOUS

## 2022-01-07 MED ORDER — OXYCODONE HCL 5 MG PO TABS
10.0000 mg | ORAL_TABLET | ORAL | Status: DC | PRN
  Administered 2022-01-07 – 2022-01-08 (×5): 10 mg via ORAL
  Filled 2022-01-07 (×5): qty 2

## 2022-01-07 MED ORDER — THROMBIN 20000 UNITS EX SOLR
CUTANEOUS | Status: DC | PRN
  Administered 2022-01-07: 10 mL via TOPICAL

## 2022-01-07 MED ORDER — CHLORHEXIDINE GLUCONATE 0.12 % MT SOLN
15.0000 mL | Freq: Once | OROMUCOSAL | Status: AC
  Administered 2022-01-07: 15 mL via OROMUCOSAL
  Filled 2022-01-07: qty 15

## 2022-01-07 MED ORDER — MENTHOL 3 MG MT LOZG: 1.0000 | LOZENGE | OROMUCOSAL | Status: DC | PRN

## 2022-01-07 MED ORDER — PROPOFOL 10 MG/ML IV BOLUS
INTRAVENOUS | Status: DC | PRN
  Administered 2022-01-07: 50 mg via INTRAVENOUS
  Administered 2022-01-07: 150 mg via INTRAVENOUS

## 2022-01-07 MED ORDER — ROCURONIUM BROMIDE 10 MG/ML (PF) SYRINGE
PREFILLED_SYRINGE | INTRAVENOUS | Status: AC
  Filled 2022-01-07: qty 10

## 2022-01-07 MED ORDER — POLYETHYLENE GLYCOL 3350 17 G PO PACK: 17.0000 g | PACK | Freq: Every day | ORAL | 0 refills | Status: AC

## 2022-01-07 MED ORDER — ONDANSETRON HCL 4 MG PO TABS: 4.0000 mg | ORAL_TABLET | Freq: Four times a day (QID) | ORAL | Status: DC | PRN

## 2022-01-07 MED ORDER — DOCUSATE SODIUM 100 MG PO CAPS
100.0000 mg | ORAL_CAPSULE | Freq: Two times a day (BID) | ORAL | Status: DC
  Administered 2022-01-07 – 2022-01-08 (×3): 100 mg via ORAL
  Filled 2022-01-07 (×3): qty 1

## 2022-01-07 MED ORDER — MIDAZOLAM HCL 2 MG/2ML IJ SOLN
INTRAMUSCULAR | Status: AC
  Filled 2022-01-07: qty 2

## 2022-01-07 MED ORDER — RISAQUAD PO CAPS
1.0000 | ORAL_CAPSULE | Freq: Every day | ORAL | Status: DC
  Administered 2022-01-07 – 2022-01-08 (×2): 1 via ORAL
  Filled 2022-01-07 (×2): qty 1

## 2022-01-07 MED ORDER — ALUM & MAG HYDROXIDE-SIMETH 200-200-20 MG/5ML PO SUSP: 30.0000 mL | Freq: Four times a day (QID) | ORAL | Status: DC | PRN

## 2022-01-07 MED ORDER — LIDOCAINE 2% (20 MG/ML) 5 ML SYRINGE
INTRAMUSCULAR | Status: DC | PRN
  Administered 2022-01-07: 100 mg via INTRAVENOUS

## 2022-01-07 MED ORDER — ROCURONIUM BROMIDE 10 MG/ML (PF) SYRINGE
PREFILLED_SYRINGE | INTRAVENOUS | Status: DC | PRN
  Administered 2022-01-07: 60 mg via INTRAVENOUS
  Administered 2022-01-07 (×3): 20 mg via INTRAVENOUS

## 2022-01-07 MED ORDER — FENTANYL CITRATE (PF) 250 MCG/5ML IJ SOLN
INTRAMUSCULAR | Status: AC
  Filled 2022-01-07: qty 5

## 2022-01-07 MED ORDER — MIDAZOLAM HCL 5 MG/5ML IJ SOLN
INTRAMUSCULAR | Status: DC | PRN
  Administered 2022-01-07: 2 mg via INTRAVENOUS

## 2022-01-07 MED ORDER — ACETAMINOPHEN 650 MG RE SUPP: 650.0000 mg | RECTAL | Status: DC | PRN

## 2022-01-07 MED ORDER — KCL IN DEXTROSE-NACL 20-5-0.45 MEQ/L-%-% IV SOLN
INTRAVENOUS | Status: DC
  Filled 2022-01-07: qty 1000

## 2022-01-07 MED ORDER — METHOCARBAMOL 750 MG PO TABS: 750.0000 mg | ORAL_TABLET | Freq: Three times a day (TID) | ORAL | 1 refills | Status: AC | PRN

## 2022-01-07 MED ORDER — KETAMINE HCL 10 MG/ML IJ SOLN
INTRAMUSCULAR | Status: DC | PRN
  Administered 2022-01-07: 20 mg via INTRAVENOUS

## 2022-01-07 MED ORDER — POLYETHYLENE GLYCOL 3350 17 G PO PACK: 17.0000 g | PACK | Freq: Every day | ORAL | Status: DC | PRN

## 2022-01-07 MED ORDER — ORAL CARE MOUTH RINSE: 15.0000 mL | Freq: Once | OROMUCOSAL | Status: AC

## 2022-01-07 MED ORDER — ACETAMINOPHEN 325 MG PO TABS
650.0000 mg | ORAL_TABLET | ORAL | Status: DC | PRN
  Administered 2022-01-07 (×2): 650 mg via ORAL
  Filled 2022-01-07 (×2): qty 2

## 2022-01-07 MED ORDER — BISACODYL 5 MG PO TBEC: 5.0000 mg | DELAYED_RELEASE_TABLET | Freq: Every day | ORAL | Status: DC | PRN

## 2022-01-07 MED ORDER — PHENOL 1.4 % MT LIQD: 1.0000 | OROMUCOSAL | Status: DC | PRN

## 2022-01-07 MED ORDER — SUGAMMADEX SODIUM 200 MG/2ML IV SOLN
INTRAVENOUS | Status: DC | PRN
  Administered 2022-01-07: 200 mg via INTRAVENOUS

## 2022-01-07 MED ORDER — KETAMINE HCL 50 MG/5ML IJ SOSY
PREFILLED_SYRINGE | INTRAMUSCULAR | Status: AC
  Filled 2022-01-07: qty 5

## 2022-01-07 MED ORDER — LACTATED RINGERS IV SOLN: INTRAVENOUS | Status: DC | PRN

## 2022-01-07 MED ORDER — CEFAZOLIN SODIUM-DEXTROSE 2-4 GM/100ML-% IV SOLN
2.0000 g | INTRAVENOUS | Status: AC
  Administered 2022-01-07: 2 g via INTRAVENOUS
  Filled 2022-01-07: qty 100

## 2022-01-07 MED ORDER — METHOCARBAMOL 1000 MG/10ML IJ SOLN: 500.0000 mg | Freq: Four times a day (QID) | INTRAVENOUS | Status: DC | PRN

## 2022-01-07 MED ORDER — FENTANYL CITRATE (PF) 100 MCG/2ML IJ SOLN
25.0000 ug | INTRAMUSCULAR | Status: DC | PRN
  Administered 2022-01-07 (×2): 50 ug via INTRAVENOUS

## 2022-01-07 MED ORDER — FENTANYL CITRATE (PF) 250 MCG/5ML IJ SOLN
INTRAMUSCULAR | Status: DC | PRN
  Administered 2022-01-07 (×2): 50 ug via INTRAVENOUS
  Administered 2022-01-07: 150 ug via INTRAVENOUS

## 2022-01-07 MED ORDER — LORATADINE 10 MG PO TABS
10.0000 mg | ORAL_TABLET | Freq: Every day | ORAL | Status: DC
  Administered 2022-01-07 – 2022-01-08 (×2): 10 mg via ORAL
  Filled 2022-01-07 (×2): qty 1

## 2022-01-07 MED ORDER — CEFAZOLIN SODIUM-DEXTROSE 2-4 GM/100ML-% IV SOLN
2.0000 g | Freq: Three times a day (TID) | INTRAVENOUS | Status: AC
  Administered 2022-01-07 (×2): 2 g via INTRAVENOUS
  Filled 2022-01-07 (×2): qty 100

## 2022-01-07 MED ORDER — BUPIVACAINE-EPINEPHRINE (PF) 0.5% -1:200000 IJ SOLN
INTRAMUSCULAR | Status: AC
  Filled 2022-01-07: qty 30

## 2022-01-07 SURGICAL SUPPLY — 58 items
BAG COUNTER SPONGE SURGICOUNT (BAG) ×2 IMPLANT
BAG DECANTER FOR FLEXI CONT (MISCELLANEOUS) IMPLANT
BAG SPNG CNTER NS LX DISP (BAG) ×1
BAND INSRT 18 STRL LF DISP RB (MISCELLANEOUS) ×2
BAND RUBBER #18 3X1/16 STRL (MISCELLANEOUS) ×4 IMPLANT
BUR EGG ELITE 5.0 (BURR) IMPLANT
BUR RND DIAMOND ELITE 4.0 (BURR) IMPLANT
CLEANER TIP ELECTROSURG 2X2 (MISCELLANEOUS) ×2 IMPLANT
CNTNR URN SCR LID CUP LEK RST (MISCELLANEOUS) ×2 IMPLANT
CONT SPEC 4OZ STRL OR WHT (MISCELLANEOUS) ×1
DRAPE LAPAROTOMY 100X72X124 (DRAPES) ×2 IMPLANT
DRAPE MICROSCOPE SLANT 54X150 (MISCELLANEOUS) ×2 IMPLANT
DRAPE SHEET LG 3/4 BI-LAMINATE (DRAPES) ×2 IMPLANT
DRAPE SURG 17X11 SM STRL (DRAPES) ×2 IMPLANT
DRAPE UTILITY XL STRL (DRAPES) ×2 IMPLANT
DRSG AQUACEL AG ADV 3.5X 4 (GAUZE/BANDAGES/DRESSINGS) IMPLANT
DRSG AQUACEL AG ADV 3.5X 6 (GAUZE/BANDAGES/DRESSINGS) IMPLANT
DRSG TELFA 3X8 NADH STRL (GAUZE/BANDAGES/DRESSINGS) IMPLANT
DURAPREP 26ML APPLICATOR (WOUND CARE) ×2 IMPLANT
DURASEAL SPINE SEALANT 3ML (MISCELLANEOUS) IMPLANT
ELECT BLADE 4.0 EZ CLEAN MEGAD (MISCELLANEOUS)
ELECT REM PT RETURN 9FT ADLT (ELECTROSURGICAL) ×1
ELECTRODE BLDE 4.0 EZ CLN MEGD (MISCELLANEOUS) IMPLANT
ELECTRODE REM PT RTRN 9FT ADLT (ELECTROSURGICAL) ×2 IMPLANT
GLOVE BIOGEL PI IND STRL 7.5 (GLOVE) ×2 IMPLANT
GLOVE SURG SS PI 7.0 STRL IVOR (GLOVE) ×2 IMPLANT
GLOVE SURG SS PI 8.0 STRL IVOR (GLOVE) ×4 IMPLANT
GOWN STRL REUS W/ TWL LRG LVL3 (GOWN DISPOSABLE) ×2 IMPLANT
GOWN STRL REUS W/ TWL XL LVL3 (GOWN DISPOSABLE) ×2 IMPLANT
GOWN STRL REUS W/TWL LRG LVL3 (GOWN DISPOSABLE) ×1
GOWN STRL REUS W/TWL XL LVL3 (GOWN DISPOSABLE) ×2
IV CATH 14GX2 1/4 (CATHETERS) ×2 IMPLANT
KIT BASIN OR (CUSTOM PROCEDURE TRAY) ×2 IMPLANT
NDL 22X1.5 STRL (OR ONLY) (MISCELLANEOUS) ×2 IMPLANT
NDL SPNL 18GX3.5 QUINCKE PK (NEEDLE) ×4 IMPLANT
NEEDLE 22X1.5 STRL (OR ONLY) (MISCELLANEOUS) ×1 IMPLANT
NEEDLE SPNL 18GX3.5 QUINCKE PK (NEEDLE) ×2 IMPLANT
PACK LAMINECTOMY NEURO (CUSTOM PROCEDURE TRAY) ×2 IMPLANT
PATTIES SURGICAL .75X.75 (GAUZE/BANDAGES/DRESSINGS) ×2 IMPLANT
SOLUTION PRONTOSAN WOUND 350ML (IRRIGATION / IRRIGATOR) IMPLANT
SPONGE SURGIFOAM ABS GEL 100 (HEMOSTASIS) ×2 IMPLANT
SPONGE T-LAP 4X18 ~~LOC~~+RFID (SPONGE) IMPLANT
STAPLER VISISTAT (STAPLE) IMPLANT
STRIP CLOSURE SKIN 1/2X4 (GAUZE/BANDAGES/DRESSINGS) ×2 IMPLANT
SUT NURALON 4 0 TR CR/8 (SUTURE) IMPLANT
SUT PROLENE 3 0 PS 2 (SUTURE) IMPLANT
SUT VIC AB 1 CT1 27 (SUTURE)
SUT VIC AB 1 CT1 27XBRD ANTBC (SUTURE) IMPLANT
SUT VIC AB 1-0 CT2 27 (SUTURE) IMPLANT
SUT VIC AB 2-0 CT1 27 (SUTURE)
SUT VIC AB 2-0 CT1 TAPERPNT 27 (SUTURE) IMPLANT
SUT VIC AB 2-0 CT2 27 (SUTURE) IMPLANT
SYR 3ML LL SCALE MARK (SYRINGE) ×2 IMPLANT
TOWEL GREEN STERILE (TOWEL DISPOSABLE) ×2 IMPLANT
TOWEL GREEN STERILE FF (TOWEL DISPOSABLE) ×2 IMPLANT
TRAY FOLEY MTR SLVR 16FR STAT (SET/KITS/TRAYS/PACK) ×2 IMPLANT
WIPE CHG 2% 2PK PREOPERATIVE (MISCELLANEOUS) ×2 IMPLANT
YANKAUER SUCT BULB TIP NO VENT (SUCTIONS) ×2 IMPLANT

## 2022-01-07 NOTE — Interval H&P Note (Signed)
History and Physical Interval Note:  01/07/2022 7:25 AM  [name]  has presented today for surgery, with the diagnosis of Herniated disc and stenosis recurrent L5-S1 right.  The various methods of treatment have been discussed with the patient and family. After consideration of risks, benefits and other options for treatment, the patient has consented to  Procedure(s) with comments: Revision microdiscectomy L5-S1 right (Right) - 3 C-Bed as a surgical intervention.  The patient's history has been reviewed, patient examined, no change in status, stable for surgery.  I have reviewed the patient's chart and labs.  Questions were answered to the patient's satisfaction.     [name]

## 2022-01-07 NOTE — Discharge Instructions (Signed)

## 2022-01-07 NOTE — Anesthesia Postprocedure Evaluation (Signed)
Anesthesia Post Note  Patient: [name]  Procedure(s) Performed: Revision microdiscectomy Lumbar Five-Sacral One right (Right)     Patient location during evaluation: PACU Anesthesia Type: General Level of consciousness: awake Pain management: pain level controlled Vital Signs Assessment: post-procedure vital signs reviewed and stable Respiratory status: spontaneous breathing Cardiovascular status: stable Postop Assessment: no apparent nausea or vomiting Anesthetic complications: no   No notable events documented.  Last Vitals:  Vitals:   01/07/22 1100 01/07/22 1128  BP: 126/81 (!) 145/86  Pulse: 93 84  Resp: 14 18  Temp: 36.9 C 36.5 C  SpO2: 99% 100%    Last Pain:  Vitals:   01/07/22 1230  TempSrc:   PainSc: 6                  Jorma Tassinari

## 2022-01-07 NOTE — Op Note (Signed)
NAME: [name], [name] MEDICAL RECORD NO: [id number] ACCOUNT NO: [id number] DATE OF BIRTH: [birth year] FACILITY: [facility] LOCATION: [facility] PHYSICIAN: Johnn Hai, MD  Operative Report   DATE OF PROCEDURE: 01/07/2022  PREOPERATIVE DIAGNOSIS:  Spinal stenosis, recurrent disk herniation, L5-S1, right.  POSTOPERATIVE DIAGNOSES:  Spinal stenosis, recurrent disk herniation, L5-S1, right.  PROCEDURE PERFORMED: 1.  Revision lumbar decompression with revision hemilaminotomy L5-S1, right with foraminotomies L5 and S1. 2.  Revision microdiskectomy L5-S1, right.  ANESTHESIA:  General.  ASSISTANT:  Lacie Draft, PA.  HISTORY:  This is a 41 year old male who has a remote history of disk herniation with surgery of a decompression L5-S1.  He had recurrent disk herniation with right lower extremity radicular pain, weakness in plantarflexion, neural tension signs, failing  conservative treatment, indicated for decompression of the S1 nerve root.  Disk degeneration and foraminal narrowing L5 and S1.  He was indicated foraminotomies as well.  Risks and benefits discussed including bleeding, infection, damage to  neurovascular structures, no change in symptoms, worsening symptoms, DVT, PE, anesthetic complications, etc.  DESCRIPTION OF PROCEDURE:  With the patient in supine position, after induction of adequate general anesthesia, and 2 grams Kefzol he was placed prone on the Wilson frame.  All bony prominences were well padded.  Lumbar region was prepped and draped in  the usual sterile fashion.  Two 18-gauge spinal needles utilized to localize 5-1 interspace, confirmed with x-ray.  Incision was made from the spinous process, L5-S1 on the previous surgical incision.  I excised the previous skin scar.  Subcutaneous  tissue was dissected.  Electrocautery was utilized to achieve hemostasis.  Dorsal lumbar fascia divided in line with skin incision.  Paraspinous muscles elevated from lamina of L5 and S1 with a  curette and a Cobb.  McCulloch retractor was placed.   Confirmatory radiograph obtained confirming L5-S1.  A straight curette was further utilized to skeletonize the previous hemilaminotomy on to the operating microscope.  We preserved the capsule of the facet.  The laminotomy of L5 and the cephalad edge of  S1 was identified.  The inferior process of L5 was noted.  I then developed the plane just to the medial aspect of that developing a plane between the scar tissue and the previous laminotomy.  I then used a high-speed bur to remove approximately 2 mm of  the inferior portion of 5.  Down to the inferior aspect of the joint in the safe space.  Bone wax was placed on the cancellous surfaces.  I used micro curette then to skeletonize the superior articulating process of S1 and the previous foraminotomy of  S1.  There was some bony encroachment noted.  After developing a plane between the epidural and the epidural fibrosis in the superior articulating process.  I then used a 2 mm Kerrison to decompress the lateral recess to the medial border of the pedicle.   I enlarge the foraminotomy of S1, protecting the S1 nerve root.  This mobilized it distally.  We identified the pedicle of S1 and just cephalad to this, identified the disk space.  I performed a foraminotomy of 5 first by using a straight curette to  remove the medial aspect of the tip of the superior articulating process.  After identifying the disk space there was epidural venous plexus over that were cauterized and divided that.  I used a neuro patty and a Penfield to clear the annulus of  fibrosis.  Cephalad and caudad at the disk space.  We then gently  mobilized it medially.  I used D'Errico to protect the thecal sac and the S1 nerve root.  I performed an annulotomy at the disk, which was noted to have a herniation.  I removed disk  herniation with multiple passes of a micropituitary and upbiting pituitary.  Further mobilized with an Epstein  preserving the endplates.  Depth was approximately 2 cm only.  I used a Woodson retractor to mobilize disk herniation centrally into the disk  space.  Further retrieving it with a upbiting micropituitary.  I made multiple passes.  Retrieved multiple fragments.  Following this, I felt this decompressed the lateral recess.  A Woodson probe passed freely out the foramen of S1 and also of 5 and  cephalad above the pedicle of 5.  There was epidural fibrosis noted at cephalad.  Following this, a confirmatory radiograph was obtained with a Penfield in the disk space.  Following this, I copiously lavaged it with catheter lavage and used  micropituitary to retrieve additional fragments.  This was lavaged again.  No further fragments were noted.  I copiously irrigated the wound.  No evidence of CSF leakage or active bleeding.  I placed thrombin-soaked Gelfoam and then retrieved it.  Bone  wax was placed on the cancellous surfaces.  McCulloch retractor was removed.  Paraspinous muscles inspected.  No evidence of active bleeding and they were copiously irrigated with saline as well as a digital extirpation.  Following this without active  bleeding I reapproximated the dorsal lumbar fascia with #1 Vicryl in interrupted figure-of-eight sutures, subcutaneous with 2-0 and skin with Prolene.  Sterile dressing was applied.  Placed supine on the hospital bed, extubated without difficulty and  transported to the recovery room in satisfactory condition.  The patient tolerated the procedure well.  No complications.  ASSISTANT:  Andrez Grime, PA, was used throughout the case for patient positioning, general intermittent neural retraction, closure, suction.  ESTIMATED BLOOD LOSS:  25 mL.   PUS D: 01/07/2022 10:26:14 am T: 01/07/2022 2:10:00 pm  JOB: [id number]/ [id number]

## 2022-01-07 NOTE — Transfer of Care (Signed)
Immediate Anesthesia Transfer of Care Note  Patient: [name]  Procedure(s) Performed: Revision microdiscectomy Lumbar Five-Sacral One right (Right)  Patient Location: PACU  Anesthesia Type:General  Level of Consciousness: awake, alert , oriented, patient cooperative, and responds to stimulation  Airway & Oxygen Therapy: Patient Spontanous Breathing  Post-op Assessment: Report given to RN, Post -op Vital signs reviewed and stable, and Patient moving all extremities X 4  Post vital signs: Reviewed and stable  Last Vitals:  Vitals Value Taken Time  BP 133/86 01/07/22 1030  Temp    Pulse 96 01/07/22 1033  Resp 16 01/07/22 1033  SpO2 99 % 01/07/22 1033  Vitals shown include unvalidated device data.  Last Pain:  Vitals:   01/07/22 0615  TempSrc: Oral  PainSc: 10-Worst pain ever      Patients Stated Pain Goal: 2 (42/39/53 2023)  Complications: No notable events documented.

## 2022-01-07 NOTE — Brief Op Note (Signed)
01/07/2022  7:28 AM  PATIENT:  [name]  41 y.o. male  PRE-OPERATIVE DIAGNOSIS:  Herniated disc and stenosis recurrent L5-S1 right  POST-OPERATIVE DIAGNOSIS:  * No post-op diagnosis entered *  PROCEDURE:  Procedure(s) with comments: Revision microdiscectomy L5-S1 right (Right) - 3 C-Bed  SURGEON:  Surgeon(s) and Role:    Susa Day, MD - Primary  PHYSICIAN ASSISTANT:   ASSISTANTS: Bissell   ANESTHESIA:   general  EBL:  25   BLOOD ADMINISTERED:none  DRAINS: none   LOCAL MEDICATIONS USED:  MARCAINE     SPECIMEN:  No Specimen  DISPOSITION OF SPECIMEN:  N/A  COUNTS:  YES  TOURNIQUET:  * No tourniquets in log *  DICTATION: .Other Dictation: Dictation Number [id number]  PLAN OF CARE: Admit for overnight observation  PATIENT DISPOSITION:  PACU - hemodynamically stable.   Delay start of Pharmacological VTE agent (>24hrs) due to surgical blood loss or risk of bleeding: yes

## 2022-01-07 NOTE — Anesthesia Preprocedure Evaluation (Signed)
Anesthesia Evaluation  Patient identified by MRN, date of birth, ID band Patient awake  General Assessment Comment:Hx noted Dr. Nyoka Cowden  Reviewed: Allergy & Precautions, NPO status , Patient's Chart, lab work & pertinent test results  Airway Mallampati: II       Dental   Pulmonary Current Smoker and Patient abstained from smoking.   breath sounds clear to auscultation       Cardiovascular negative cardio ROS  Rhythm:Regular Rate:Normal     Neuro/Psych  Headaches    GI/Hepatic negative GI ROS, Neg liver ROS,,,  Endo/Other  negative endocrine ROS    Renal/GU negative Renal ROS     Musculoskeletal   Abdominal   Peds  Hematology   Anesthesia Other Findings   Reproductive/Obstetrics                             Anesthesia Physical Anesthesia Plan  ASA: 3  Anesthesia Plan: General   Post-op Pain Management:    Induction: Intravenous  PONV Risk Score and Plan: 2 and Ondansetron, Dexamethasone and Midazolam  Airway Management Planned: Oral ETT  Additional Equipment:   Intra-op Plan:   Post-operative Plan: Extubation in OR  Informed Consent: I have reviewed the patients History and Physical, chart, labs and discussed the procedure including the risks, benefits and alternatives for the proposed anesthesia with the patient or authorized representative who has indicated his/her understanding and acceptance.     Dental advisory given  Plan Discussed with: CRNA and Anesthesiologist  Anesthesia Plan Comments:        Anesthesia Quick Evaluation

## 2022-01-08 ENCOUNTER — Encounter (HOSPITAL_COMMUNITY): Payer: Self-pay | Admitting: Specialist

## 2022-01-08 DIAGNOSIS — M5117 Intervertebral disc disorders with radiculopathy, lumbosacral region: Secondary | ICD-10-CM | POA: Diagnosis not present

## 2022-01-08 DIAGNOSIS — M4807 Spinal stenosis, lumbosacral region: Secondary | ICD-10-CM | POA: Diagnosis not present

## 2022-01-08 DIAGNOSIS — F17210 Nicotine dependence, cigarettes, uncomplicated: Secondary | ICD-10-CM | POA: Diagnosis not present

## 2022-01-08 NOTE — Progress Notes (Signed)
PT Cancellation Note  Patient Details Name: Buckley Bradly MRN: [id number] DOB: 10/14/1980   Cancelled Treatment:    Reason Eval/Treat Not Completed: PT screened, no needs identified, will sign off Per OT, patient functioning at modI-Independent level. All education completed by OT. No skilled PT needs required. PT will sign off.   Kiril Hippe A. Gilford Rile PT, DPT Acute Rehabilitation Services Office [phone]    Linna Hoff 01/08/2022, 9:15 AM

## 2022-01-08 NOTE — Progress Notes (Signed)
Subjective: 1 Day Post-Op Procedure(s) (LRB): Revision microdiscectomy Lumbar Five-Sacral One right (Right) Patient reports pain as moderate.   Reports incisional pain, some R leg pain but significantly improved from pre-op. No CP, SOB, fever, chills, N/V. Ready to go home. Tolerating walking well. No difficulty voiding.  Objective: Vital signs in last 24 hours: Temp:  [97.7 F (36.5 C)-98.6 F (37 C)] 97.8 F (36.6 C) (11/03 0754) Pulse Rate:  [67-96] 67 (11/03 0754) Resp:  [13-20] 17 (11/03 0754) BP: (118-145)/(76-86) 126/85 (11/03 0754) SpO2:  [97 %-100 %] 99 % (11/03 0754)  Intake/Output from previous day: 11/02 0701 - 11/03 0700 In: 1680 [P.O.:480; I.V.:800; IV Piggyback:400] Out: 275 [Urine:250; Blood:25] Intake/Output this shift: No intake/output data recorded.  No results for input(s): "HGB" in the last 72 hours. No results for input(s): "WBC", "RBC", "HCT", "PLT" in the last 72 hours. No results for input(s): "NA", "K", "CL", "CO2", "BUN", "CREATININE", "GLUCOSE", "CALCIUM" in the last 72 hours. No results for input(s): "LABPT", "INR" in the last 72 hours.  Neurologically intact ABD soft Neurovascular intact Sensation intact distally Intact pulses distally Dorsiflexion/Plantar flexion intact Incision: dressing C/D/I and no drainage No cellulitis present Compartment soft No sign of DVT   Assessment/Plan: 1 Day Post-Op Procedure(s) (LRB): Revision microdiscectomy Lumbar Five-Sacral One right (Right) Advance diet Up with therapy D/C IV fluids Discussed D/C instructions, Lspine precautions, dressing instructions D/C to home today   Cecilie Kicks 01/08/2022, 8:01 AM

## 2022-01-08 NOTE — Discharge Summary (Signed)
Physician Discharge Summary   Patient ID: [name] MRN: [id number] DOB/AGE: 1980/05/05 41 y.o.  Admit date: 01/07/2022 Discharge date: 01/08/2022  Primary Diagnosis:   Herniated disc and stenosis recurrent L5-S1 right  Admission Diagnoses:  Past Medical History:  Diagnosis Date   Depression    Headache    Discharge Diagnoses:   Principal Problem:   HNP (herniated nucleus pulposus), lumbar  Procedure:  Procedure(s) (LRB): Revision microdiscectomy Lumbar Five-Sacral One right (Right)   Consults: None  HPI:  See H&P    Laboratory Data: Hospital Outpatient Visit on 12/29/2021  Component Date Value Ref Range Status   WBC 12/29/2021 11.9 (H)  4.0 - 10.5 K/uL Final   RBC 12/29/2021 4.19 (L)  4.22 - 5.81 MIL/uL Final   Hemoglobin 12/29/2021 14.2  13.0 - 17.0 g/dL Final   HCT 18/29/9371 39.8  39.0 - 52.0 % Final   MCV 12/29/2021 95.0  80.0 - 100.0 fL Final   MCH 12/29/2021 33.9  26.0 - 34.0 pg Final   MCHC 12/29/2021 35.7  30.0 - 36.0 g/dL Final   RDW 69/67/8938 11.6  11.5 - 15.5 % Final   Platelets 12/29/2021 260  150 - 400 K/uL Final   nRBC 12/29/2021 0.0  0.0 - 0.2 % Final   Performed at [facility], [address], [place], [place] [address]   MRSA, PCR 12/29/2021 NEGATIVE  NEGATIVE Final   Staphylococcus aureus 12/29/2021 NEGATIVE  NEGATIVE Final   Comment: (NOTE) The Xpert SA Assay (FDA approved for NASAL specimens in patients 48 years of age and older), is one component of a comprehensive surveillance program. It is not intended to diagnose infection nor to guide or monitor treatment. Performed at [facility], [address], [place], [place] [address]    No results for input(s): "HGB" in the last 72 hours. No results for input(s): "WBC", "RBC", "HCT", "PLT" in the last 72 hours. No results for input(s): "NA", "K", "CL", "CO2", "BUN", "CREATININE", "GLUCOSE", "CALCIUM" in the last 72 hours. No results for input(s): "LABPT", "INR" in  the last 72 hours.  X-Rays:DG Lumbar Spine 2-3 Views  Result Date: 01/07/2022 CLINICAL DATA:  L5-S1 micro discectomy. EXAM: LUMBAR SPINE - 2-3 VIEW COMPARISON:  December 29, 2021. FINDINGS: Three intraoperative cross-table lateral projections were obtained of lumbar spine. Due to transitional lumbosacral anatomy, numbering of spinal levels is similar to exam of December 29, 2021. The first image demonstrates surgical probes at approximately the L5 and S1 levels. The second image demonstrates surgical probe at approximately the S1 level. The third image demonstrates surgical probe projected over the L5-S1 disc space. IMPRESSION: Surgical localization as described above. Electronically Signed   By: Lupita Raider M.D.   On: 01/07/2022 10:27   DG Lumbar Spine 2-3 Views  Result Date: 12/31/2021 CLINICAL DATA:  Herniated nucleus polyposis, lumbar. Preop. Please label vertebral bodies. EXAM: LUMBAR SPINE - 2-3 VIEW COMPARISON:  Remote lumbar spine exam 07/03/2007 FINDINGS: The upper aspect of L1 is not entirely included on the AP view. There are 5 non-rib-bearing lumbar vertebra with partial lumbarization of S1. There is a small S1-S2 disc space. Broad-based dextroscoliotic curvature. The bones are subjectively under mineralized. Slight undulation of L2 superior endplate. Anterior spurring at multiple levels. Lower lumbar facet hypertrophy. IMPRESSION: 1. 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 Spinal levels were numbered based on a small S1-S2 disc space. 2. Anterior spurring at multiple levels. Lower lumbar facet hypertrophy. 3. Broad-based dextroscoliotic curvature.  Electronically Signed   By: Keith Rake M.D.   On: 12/31/2021 11:25   DG Chest 2 View  Result Date: 12/14/2021 CLINICAL DATA:  Preop clearance for lumbar surgery. EXAM: CHEST - 2 VIEW COMPARISON:  Chest radiograph 09/12/2017 FINDINGS: The cardiomediastinal contours are within normal limits. The lungs are clear. No  pneumothorax or pleural effusion. No acute finding in the visualized skeleton. IMPRESSION: No active cardiopulmonary disease. Electronically Signed   By: Audie Pinto M.D.   On: 12/14/2021 16:47    EKG:No orders found for this or any previous visit.   Hospital Course: Patient was admitted to [facility] and taken to the OR and underwent the above state procedure without complications.  Patient tolerated the procedure well and was later transferred to the recovery room and then to the orthopaedic floor for postoperative care.  They were given PO and IV analgesics for pain control following their surgery.  They were given 24 hours of postoperative antibiotics.   PT was consulted postop to assist with mobility and transfers.  The patient was allowed to be WBAT with therapy and was taught back precautions. Discharge planning was consulted to help with postop disposition and equipment needs.  Patient had a good night on the evening of surgery and started to get up OOB with therapy on day one. Patient was seen in rounds and was ready to go home on day one.  They were given discharge instructions and dressing directions.  They were instructed on when to follow up in the office with Dr. Tonita Cong.   Diet: Regular diet Activity:WBAT, Lspine precautions Follow-up:in 10-14 days Disposition - Home Discharged Condition: good   Discharge Instructions     Call MD / Call 911   Complete by: As directed    If you experience chest pain or shortness of breath, CALL 911 and be transported to the hospital emergency room.  If you develope a fever above 101 F, pus (white drainage) or increased drainage or redness at the wound, or calf pain, call your surgeon's office.   Constipation Prevention   Complete by: As directed    Drink plenty of fluids.  Prune juice may be helpful.  You may use a stool softener, such as Colace (over the counter) 100 mg twice a day.  Use MiraLax (over the counter) for constipation as  needed.   Diet - low sodium heart healthy   Complete by: As directed    Increase activity slowly as tolerated   Complete by: As directed    Post-operative opioid taper instructions:   Complete by: As directed    POST-OPERATIVE OPIOID TAPER INSTRUCTIONS: It is important to wean off of your opioid medication as soon as possible. If you do not need pain medication after your surgery it is ok to stop day one. Opioids include: Codeine, Hydrocodone(Norco, Vicodin), Oxycodone(Percocet, oxycontin) and hydromorphone amongst others.  Long term and even short term use of opiods can cause: Increased pain response Dependence Constipation Depression Respiratory depression And more.  Withdrawal symptoms can include Flu like symptoms Nausea, vomiting And more Techniques to manage these symptoms Hydrate well Eat regular healthy meals Stay active Use relaxation techniques(deep breathing, meditating, yoga) Do Not substitute Alcohol to help with tapering If you have been on opioids for less than two weeks and do not have pain than it is ok to stop all together.  Plan to wean off of opioids This plan should start within one week post op of your joint replacement. Maintain  the same interval or time between taking each dose and first decrease the dose.  Cut the total daily intake of opioids by one tablet each day Next start to increase the time between doses. The last dose that should be eliminated is the evening dose.         Allergies as of 01/08/2022   No Known Allergies      Medication List     STOP taking these medications    aspirin-acetaminophen-caffeine 250-250-65 MG tablet Commonly known as: EXCEDRIN MIGRAINE   oxyCODONE-acetaminophen 10-325 MG tablet Commonly known as: PERCOCET       TAKE these medications    cetirizine 10 MG tablet Commonly known as: ZYRTEC Take 10 mg by mouth daily.   docusate sodium 100 MG capsule Commonly known as: Colace Take 1 capsule (100  mg total) by mouth 2 (two) times daily as needed for mild constipation.   methocarbamol 750 MG tablet Commonly known as: Robaxin-750 Take 1 tablet (750 mg total) by mouth every 8 (eight) hours as needed for muscle spasms.   Oxycodone HCl 10 MG Tabs Take 1 tablet (10 mg total) by mouth every 4 (four) hours as needed for up to 7 days.   polyethylene glycol 17 g packet Commonly known as: MIRALAX / GLYCOLAX Take 17 g by mouth daily.        Follow-up Information     Jene Every, MD Follow up in 2 week(s).   Specialty: Orthopedic Surgery Contact information: [address] [place] [address] [place] [address] [phone]                 Signed: Andrez Grime, PA-C Orthopaedic Surgery 01/08/2022, 8:02 AM

## 2022-01-08 NOTE — Evaluation (Signed)
Occupational Therapy Evaluation Patient Details Name: [name] MRN: [id number] DOB: [birth year] Today's Date: 01/08/2022   History of Present Illness 41 yo male s/p 11/2 revision micodiscectomy L 5-s1 PMH depression HA   Clinical Impression   Patient evaluated by Occupational Therapy with no further acute OT needs identified. All education has been completed and the patient has no further questions. See below for any follow-up Occupational Therapy or equipment needs. OT to sign off. Thank you for referral.        Recommendations for follow up therapy are one component of a multi-disciplinary discharge planning process, led by the attending physician.  Recommendations may be updated based on patient status, additional functional criteria and insurance authorization.   Follow Up Recommendations  No OT follow up    Assistance Recommended at Discharge None  Patient can return home with the following Assist for transportation    Functional Status Assessment  Patient has had a recent decline in their functional status and demonstrates the ability to make significant improvements in function in a reasonable and predictable amount of time.  Equipment Recommendations  None recommended by OT    Recommendations for Other Services       Precautions / Restrictions Precautions Precautions: Back      Mobility Bed Mobility Overal bed mobility: Modified Independent             General bed mobility comments: cues for back precautions    Transfers Overall transfer level: Modified independent                        Balance                                           ADL either performed or assessed with clinical judgement   ADL Overall ADL's : Modified independent                                       General ADL Comments: able to figure 4 cross, does plan to get a reacher to pick things up off the floor, completed toilet  transfer and sink level adl. pt simulated tub transfer as well   Back handout provided and reviewed adls in detail. Pt educated on:  set an alarm at night for medication, avoid sitting for long periods of time, correct bed positioning for sleeping, correct sequence for bed mobility, avoiding lifting more than 5 pounds and never wash directly over incision. All education is complete and patient indicates understanding.   Vision Patient Visual Report: No change from baseline       Perception     Praxis      Pertinent Vitals/Pain Pain Assessment Pain Assessment: 0-10 Pain Score: 4  Pain Location: back Pain Descriptors / Indicators: Operative site guarding Pain Intervention(s): Monitored during session, Premedicated before session, Repositioned     Hand Dominance Right   Extremity/Trunk Assessment Upper Extremity Assessment Upper Extremity Assessment: Overall WFL for tasks assessed   Lower Extremity Assessment Lower Extremity Assessment: Overall WFL for tasks assessed   Cervical / Trunk Assessment Cervical / Trunk Assessment: Back Surgery   Communication Communication Communication: No difficulties   Cognition Arousal/Alertness: Awake/alert Behavior During Therapy: WFL for tasks assessed/performed Overall Cognitive Status: Within Functional Limits for tasks  assessed                                       General Comments  dressing is dry and intact    Exercises     Shoulder Instructions      Home Living Family/patient expects to be discharged to:: Private residence Living Arrangements: Spouse/significant other Available Help at Discharge: Family;Available PRN/intermittently Type of Home: House (condo) Home Access: Level entry     Home Layout: One level     Bathroom Shower/Tub: Chief Strategy Officer: Standard (sink next to toilet to push up from)     Home Equipment: Hand held shower head;Cane - single point   Additional Comments:  pt has 1 dog and 3 cats. fiance can help care for animals      Prior Functioning/Environment Prior Level of Function : Independent/Modified Independent;Working/employed;Driving               ADLs Comments: works as Production designer, theatre/television/film for [employer]        OT Problem List:        OT Treatment/Interventions:      OT Goals(Current goals can be found in the care plan section) Acute Rehab OT Goals Patient Stated Goal: to be able to get FMLA forms completed by office so i can keep my job OT Goal Formulation: With patient Potential to Achieve Goals: Good  OT Frequency:      Co-evaluation              AM-PAC OT "6 Clicks" Daily Activity     Outcome Measure Help from another person eating meals?: None Help from another person taking care of personal grooming?: None Help from another person toileting, which includes using toliet, bedpan, or urinal?: None Help from another person bathing (including washing, rinsing, drying)?: None Help from another person to put on and taking off regular upper body clothing?: None Help from another person to put on and taking off regular lower body clothing?: None 6 Click Score: 24   End of Session Nurse Communication: Mobility status;Precautions  Activity Tolerance: Patient tolerated treatment well Patient left: in bed;with call bell/phone within reach;with family/visitor present  OT Visit Diagnosis: Unsteadiness on feet (R26.81)                Time: 0947-0962 OT Time Calculation (min): 21 min Charges:  OT General Charges $OT Visit: 1 Visit OT Evaluation $OT Eval Moderate Complexity: 1 Mod   [name], OTR/L  Acute Rehabilitation Services Office: [phone] .   [name] 01/08/2022, 10:28 AM

## 2022-01-08 NOTE — Plan of Care (Addendum)
Pt doing well. Pt and family given D/C instructions with verbal understanding. Rx's were sent to the pharmacy by MD. Pt's incision is clean and dry with no sign of infection. Pt's IV was removed prior to D/C. Pt D/C'd home via walking per MD order. Pt is stable @ D/C and has no other needs at this time. Ryleah Miramontes, RN  

## 2022-02-03 DIAGNOSIS — M5451 Vertebrogenic low back pain: Secondary | ICD-10-CM | POA: Diagnosis not present

## 2022-02-12 DIAGNOSIS — M5451 Vertebrogenic low back pain: Secondary | ICD-10-CM | POA: Diagnosis not present

## 2022-02-15 DIAGNOSIS — M5136 Other intervertebral disc degeneration, lumbar region: Secondary | ICD-10-CM | POA: Diagnosis not present

## 2022-02-15 DIAGNOSIS — M961 Postlaminectomy syndrome, not elsewhere classified: Secondary | ICD-10-CM | POA: Diagnosis not present

## 2022-02-15 DIAGNOSIS — Z79891 Long term (current) use of opiate analgesic: Secondary | ICD-10-CM | POA: Diagnosis not present
